# Patient Record
Sex: Female | Born: 1935 | Race: White | Hispanic: No | Smoking: Former smoker
Health system: Southern US, Community
[De-identification: ages and names within clinical notes are randomized; demographics above are authoritative.]

## PROBLEM LIST (undated history)

## (undated) DIAGNOSIS — R0609 Other forms of dyspnea: Secondary | ICD-10-CM

## (undated) DIAGNOSIS — J449 Chronic obstructive pulmonary disease, unspecified: Secondary | ICD-10-CM

## (undated) DIAGNOSIS — J45909 Unspecified asthma, uncomplicated: Secondary | ICD-10-CM

## (undated) DIAGNOSIS — I480 Paroxysmal atrial fibrillation: Secondary | ICD-10-CM

## (undated) DIAGNOSIS — I351 Nonrheumatic aortic (valve) insufficiency: Secondary | ICD-10-CM

## (undated) DIAGNOSIS — R0600 Dyspnea, unspecified: Secondary | ICD-10-CM

## (undated) DIAGNOSIS — R42 Dizziness and giddiness: Secondary | ICD-10-CM

## (undated) DIAGNOSIS — D649 Anemia, unspecified: Secondary | ICD-10-CM

## (undated) DIAGNOSIS — I350 Nonrheumatic aortic (valve) stenosis: Secondary | ICD-10-CM

## (undated) DIAGNOSIS — I10 Essential (primary) hypertension: Secondary | ICD-10-CM

## (undated) DIAGNOSIS — Z8719 Personal history of other diseases of the digestive system: Secondary | ICD-10-CM

## (undated) HISTORY — DX: Other forms of dyspnea: R06.09

## (undated) HISTORY — DX: Dizziness and giddiness: R42

## (undated) HISTORY — PX: CHOLECYSTECTOMY: SHX55

## (undated) HISTORY — DX: Personal history of other diseases of the digestive system: Z87.19

## (undated) HISTORY — DX: Dyspnea, unspecified: R06.00

## (undated) HISTORY — DX: Chronic obstructive pulmonary disease, unspecified: J44.9

## (undated) HISTORY — DX: Paroxysmal atrial fibrillation: I48.0

## (undated) HISTORY — DX: Nonrheumatic aortic (valve) insufficiency: I35.1

## (undated) HISTORY — DX: Anemia, unspecified: D64.9

## (undated) HISTORY — DX: Essential (primary) hypertension: I10

## (undated) HISTORY — DX: Nonrheumatic aortic (valve) stenosis: I35.0

## (undated) HISTORY — PX: HERNIA REPAIR: SHX51

## (undated) SURGERY — ERCP, WITH INTERVENTION IF INDICATED
Anesthesia: General

---

## 1997-06-19 ENCOUNTER — Ambulatory Visit (HOSPITAL_COMMUNITY): Admission: RE | Admit: 1997-06-19 | Discharge: 1997-06-19 | Payer: Self-pay | Admitting: *Deleted

## 1997-11-18 ENCOUNTER — Ambulatory Visit (HOSPITAL_COMMUNITY): Admission: RE | Admit: 1997-11-18 | Discharge: 1997-11-18 | Payer: Self-pay | Admitting: Obstetrics & Gynecology

## 1997-11-27 ENCOUNTER — Ambulatory Visit (HOSPITAL_COMMUNITY): Admission: RE | Admit: 1997-11-27 | Discharge: 1997-11-27 | Payer: Self-pay | Admitting: Endocrinology

## 1998-01-26 ENCOUNTER — Inpatient Hospital Stay (HOSPITAL_COMMUNITY): Admission: RE | Admit: 1998-01-26 | Discharge: 1998-02-01 | Payer: Self-pay

## 1998-03-10 ENCOUNTER — Inpatient Hospital Stay (HOSPITAL_COMMUNITY): Admission: RE | Admit: 1998-03-10 | Discharge: 1998-03-12 | Payer: Self-pay | Admitting: Plastic Surgery

## 1998-03-10 ENCOUNTER — Encounter: Payer: Self-pay | Admitting: Plastic Surgery

## 1998-03-12 ENCOUNTER — Encounter: Payer: Self-pay | Admitting: Plastic Surgery

## 1998-12-15 ENCOUNTER — Inpatient Hospital Stay (HOSPITAL_COMMUNITY): Admission: EM | Admit: 1998-12-15 | Discharge: 1998-12-18 | Payer: Self-pay

## 1998-12-15 ENCOUNTER — Encounter (INDEPENDENT_AMBULATORY_CARE_PROVIDER_SITE_OTHER): Payer: Self-pay | Admitting: Specialist

## 1998-12-15 ENCOUNTER — Encounter: Payer: Self-pay | Admitting: Gastroenterology

## 1999-02-05 ENCOUNTER — Ambulatory Visit (HOSPITAL_COMMUNITY): Admission: RE | Admit: 1999-02-05 | Discharge: 1999-02-05 | Payer: Self-pay | Admitting: Gastroenterology

## 1999-02-05 ENCOUNTER — Encounter: Payer: Self-pay | Admitting: Gastroenterology

## 1999-03-16 ENCOUNTER — Encounter (INDEPENDENT_AMBULATORY_CARE_PROVIDER_SITE_OTHER): Payer: Self-pay | Admitting: Specialist

## 1999-03-16 ENCOUNTER — Encounter: Payer: Self-pay | Admitting: Gastroenterology

## 1999-03-16 ENCOUNTER — Inpatient Hospital Stay (HOSPITAL_COMMUNITY): Admission: RE | Admit: 1999-03-16 | Discharge: 1999-03-20 | Payer: Self-pay | Admitting: Gastroenterology

## 1999-03-19 ENCOUNTER — Encounter: Payer: Self-pay | Admitting: Vascular Surgery

## 1999-08-31 ENCOUNTER — Encounter: Admission: RE | Admit: 1999-08-31 | Discharge: 1999-08-31 | Payer: Self-pay | Admitting: Internal Medicine

## 1999-08-31 ENCOUNTER — Encounter: Payer: Self-pay | Admitting: Internal Medicine

## 2000-06-12 ENCOUNTER — Other Ambulatory Visit: Admission: RE | Admit: 2000-06-12 | Discharge: 2000-06-12 | Payer: Self-pay | Admitting: Otolaryngology

## 2000-09-04 ENCOUNTER — Encounter: Admission: RE | Admit: 2000-09-04 | Discharge: 2000-09-04 | Payer: Self-pay | Admitting: Internal Medicine

## 2000-09-04 ENCOUNTER — Encounter: Payer: Self-pay | Admitting: Internal Medicine

## 2000-10-12 ENCOUNTER — Emergency Department (HOSPITAL_COMMUNITY): Admission: EM | Admit: 2000-10-12 | Discharge: 2000-10-12 | Payer: Self-pay | Admitting: Emergency Medicine

## 2003-10-31 ENCOUNTER — Ambulatory Visit (HOSPITAL_COMMUNITY): Admission: RE | Admit: 2003-10-31 | Discharge: 2003-10-31 | Payer: Self-pay | Admitting: Gastroenterology

## 2005-02-06 ENCOUNTER — Emergency Department (HOSPITAL_COMMUNITY): Admission: EM | Admit: 2005-02-06 | Discharge: 2005-02-06 | Payer: Self-pay | Admitting: Emergency Medicine

## 2005-02-11 ENCOUNTER — Ambulatory Visit (HOSPITAL_COMMUNITY): Admission: RE | Admit: 2005-02-11 | Discharge: 2005-02-13 | Payer: Self-pay | Admitting: Orthopedic Surgery

## 2005-03-02 ENCOUNTER — Ambulatory Visit: Admission: RE | Admit: 2005-03-02 | Discharge: 2005-03-02 | Payer: Self-pay | Admitting: Orthopedic Surgery

## 2007-09-21 ENCOUNTER — Emergency Department (HOSPITAL_COMMUNITY): Admission: EM | Admit: 2007-09-21 | Discharge: 2007-09-21 | Payer: Self-pay | Admitting: Emergency Medicine

## 2008-01-08 ENCOUNTER — Inpatient Hospital Stay (HOSPITAL_COMMUNITY): Admission: EM | Admit: 2008-01-08 | Discharge: 2008-01-10 | Payer: Self-pay | Admitting: Emergency Medicine

## 2008-02-18 ENCOUNTER — Encounter: Admission: RE | Admit: 2008-02-18 | Discharge: 2008-02-18 | Payer: Self-pay | Admitting: Internal Medicine

## 2008-02-29 HISTORY — PX: CARDIOVASCULAR STRESS TEST: SHX262

## 2008-03-10 ENCOUNTER — Inpatient Hospital Stay (HOSPITAL_BASED_OUTPATIENT_CLINIC_OR_DEPARTMENT_OTHER): Admission: RE | Admit: 2008-03-10 | Discharge: 2008-03-10 | Payer: Self-pay | Admitting: Cardiovascular Disease

## 2008-03-10 HISTORY — PX: CARDIAC CATHETERIZATION: SHX172

## 2008-10-31 ENCOUNTER — Inpatient Hospital Stay (HOSPITAL_COMMUNITY): Admission: EM | Admit: 2008-10-31 | Discharge: 2008-11-04 | Payer: Self-pay | Admitting: Emergency Medicine

## 2008-11-16 ENCOUNTER — Inpatient Hospital Stay (HOSPITAL_COMMUNITY): Admission: EM | Admit: 2008-11-16 | Discharge: 2008-11-23 | Payer: Self-pay | Admitting: Emergency Medicine

## 2009-10-27 ENCOUNTER — Emergency Department (HOSPITAL_COMMUNITY): Admission: EM | Admit: 2009-10-27 | Discharge: 2009-10-27 | Payer: Self-pay | Admitting: Emergency Medicine

## 2010-01-25 ENCOUNTER — Ambulatory Visit: Payer: Self-pay | Admitting: Cardiology

## 2010-02-05 ENCOUNTER — Encounter: Payer: Self-pay | Admitting: Cardiology

## 2010-02-05 ENCOUNTER — Ambulatory Visit (HOSPITAL_COMMUNITY)
Admission: RE | Admit: 2010-02-05 | Discharge: 2010-02-05 | Payer: Self-pay | Source: Home / Self Care | Attending: Cardiology | Admitting: Cardiology

## 2010-02-05 ENCOUNTER — Other Ambulatory Visit: Payer: Self-pay | Admitting: Cardiology

## 2010-02-05 ENCOUNTER — Ambulatory Visit: Admission: RE | Admit: 2010-02-05 | Discharge: 2010-02-05 | Payer: Self-pay | Source: Home / Self Care

## 2010-02-05 HISTORY — PX: TRANSTHORACIC ECHOCARDIOGRAM: SHX275

## 2010-04-14 LAB — COMPREHENSIVE METABOLIC PANEL
ALT: 24 U/L (ref 0–35)
ALT: 24 U/L (ref 0–35)
AST: 25 U/L (ref 0–37)
AST: 25 U/L (ref 0–37)
Albumin: 1.9 g/dL — ABNORMAL LOW (ref 3.5–5.2)
Albumin: 1.9 g/dL — ABNORMAL LOW (ref 3.5–5.2)
Albumin: 1.9 g/dL — ABNORMAL LOW (ref 3.5–5.2)
Albumin: 2.2 g/dL — ABNORMAL LOW (ref 3.5–5.2)
Albumin: 2.4 g/dL — ABNORMAL LOW (ref 3.5–5.2)
Alkaline Phosphatase: 53 U/L (ref 39–117)
Alkaline Phosphatase: 59 U/L (ref 39–117)
BUN: 10 mg/dL (ref 6–23)
BUN: 21 mg/dL (ref 6–23)
BUN: 23 mg/dL (ref 6–23)
BUN: 24 mg/dL — ABNORMAL HIGH (ref 6–23)
CO2: 22 mEq/L (ref 19–32)
CO2: 25 mEq/L (ref 19–32)
Calcium: 7.8 mg/dL — ABNORMAL LOW (ref 8.4–10.5)
Calcium: 8.1 mg/dL — ABNORMAL LOW (ref 8.4–10.5)
Chloride: 110 mEq/L (ref 96–112)
Creatinine, Ser: 0.77 mg/dL (ref 0.4–1.2)
Creatinine, Ser: 0.92 mg/dL (ref 0.4–1.2)
GFR calc Af Amer: 37 mL/min — ABNORMAL LOW (ref >60)
GFR calc non Af Amer: 31 mL/min — ABNORMAL LOW (ref >60)
GFR calc non Af Amer: 59 mL/min — ABNORMAL LOW (ref >60)
GFR calc non Af Amer: >60 mL/min (ref >60)
Glucose, Bld: 106 mg/dL — ABNORMAL HIGH (ref 70–99)
Glucose, Bld: 124 mg/dL — ABNORMAL HIGH (ref 70–99)
Glucose, Bld: 163 mg/dL — ABNORMAL HIGH (ref 70–99)
Glucose, Bld: 169 mg/dL — ABNORMAL HIGH (ref 70–99)
Potassium: 5 mEq/L (ref 3.5–5.1)
Potassium: 5.1 mEq/L (ref 3.5–5.1)
Sodium: 136 mEq/L (ref 135–145)
Sodium: 141 mEq/L (ref 135–145)
Total Bilirubin: 0.4 mg/dL (ref 0.3–1.2)
Total Bilirubin: 0.7 mg/dL (ref 0.3–1.2)
Total Bilirubin: 2 mg/dL — ABNORMAL HIGH (ref 0.3–1.2)
Total Protein: 5.3 g/dL — ABNORMAL LOW (ref 6.0–8.3)
Total Protein: 5.4 g/dL — ABNORMAL LOW (ref 6.0–8.3)
Total Protein: 5.5 g/dL — ABNORMAL LOW (ref 6.0–8.3)

## 2010-04-14 LAB — GLUCOSE, CAPILLARY
Glucose-Capillary: 102 mg/dL — ABNORMAL HIGH (ref 70–99)
Glucose-Capillary: 102 mg/dL — ABNORMAL HIGH (ref 70–99)
Glucose-Capillary: 107 mg/dL — ABNORMAL HIGH (ref 70–99)
Glucose-Capillary: 109 mg/dL — ABNORMAL HIGH (ref 70–99)
Glucose-Capillary: 116 mg/dL — ABNORMAL HIGH (ref 70–99)
Glucose-Capillary: 121 mg/dL — ABNORMAL HIGH (ref 70–99)
Glucose-Capillary: 125 mg/dL — ABNORMAL HIGH (ref 70–99)
Glucose-Capillary: 126 mg/dL — ABNORMAL HIGH (ref 70–99)
Glucose-Capillary: 138 mg/dL — ABNORMAL HIGH (ref 70–99)
Glucose-Capillary: 138 mg/dL — ABNORMAL HIGH (ref 70–99)
Glucose-Capillary: 142 mg/dL — ABNORMAL HIGH (ref 70–99)
Glucose-Capillary: 155 mg/dL — ABNORMAL HIGH (ref 70–99)
Glucose-Capillary: 157 mg/dL — ABNORMAL HIGH (ref 70–99)
Glucose-Capillary: 165 mg/dL — ABNORMAL HIGH (ref 70–99)
Glucose-Capillary: 165 mg/dL — ABNORMAL HIGH (ref 70–99)
Glucose-Capillary: 173 mg/dL — ABNORMAL HIGH (ref 70–99)
Glucose-Capillary: 174 mg/dL — ABNORMAL HIGH (ref 70–99)
Glucose-Capillary: 216 mg/dL — ABNORMAL HIGH (ref 70–99)
Glucose-Capillary: 217 mg/dL — ABNORMAL HIGH (ref 70–99)
Glucose-Capillary: 87 mg/dL (ref 70–99)

## 2010-04-14 LAB — CBC
HCT: 24.8 % — ABNORMAL LOW (ref 36.0–46.0)
HCT: 26.7 % — ABNORMAL LOW (ref 36.0–46.0)
HCT: 30.7 % — ABNORMAL LOW (ref 36.0–46.0)
HCT: 33.7 % — ABNORMAL LOW (ref 36.0–46.0)
HCT: 34.1 % — ABNORMAL LOW (ref 36.0–46.0)
Hemoglobin: 10.4 g/dL — ABNORMAL LOW (ref 12.0–15.0)
Hemoglobin: 8 g/dL — ABNORMAL LOW (ref 12.0–15.0)
Hemoglobin: 8.3 g/dL — ABNORMAL LOW (ref 12.0–15.0)
Hemoglobin: 9.9 g/dL — ABNORMAL LOW (ref 12.0–15.0)
MCHC: 33.5 g/dL (ref 30.0–36.0)
MCHC: 33.6 g/dL (ref 30.0–36.0)
MCV: 93.7 fL (ref 78.0–100.0)
MCV: 95.4 fL (ref 78.0–100.0)
MCV: 95.6 fL (ref 78.0–100.0)
Platelets: 242 10*3/uL (ref 150–400)
Platelets: 262 10*3/uL (ref 150–400)
Platelets: 264 10*3/uL (ref 150–400)
Platelets: 462 10*3/uL — ABNORMAL HIGH (ref 150–400)
RBC: 2.56 MIL/uL — ABNORMAL LOW (ref 3.87–5.11)
RBC: 2.59 MIL/uL — ABNORMAL LOW (ref 3.87–5.11)
RBC: 3.29 MIL/uL — ABNORMAL LOW (ref 3.87–5.11)
RDW: 14 % (ref 11.5–15.5)
RDW: 14.8 % (ref 11.5–15.5)
RDW: 16.6 % — ABNORMAL HIGH (ref 11.5–15.5)
RDW: 17.5 % — ABNORMAL HIGH (ref 11.5–15.5)
WBC: 12.2 10*3/uL — ABNORMAL HIGH (ref 4.0–10.5)
WBC: 21.9 10*3/uL — ABNORMAL HIGH (ref 4.0–10.5)
WBC: 6.2 10*3/uL (ref 4.0–10.5)
WBC: 7.8 10*3/uL (ref 4.0–10.5)
WBC: 9.3 10*3/uL (ref 4.0–10.5)

## 2010-04-14 LAB — BASIC METABOLIC PANEL
BUN: 17 mg/dL (ref 6–23)
CO2: 26 mEq/L (ref 19–32)
Chloride: 111 mEq/L (ref 96–112)
Creatinine, Ser: 0.68 mg/dL (ref 0.4–1.2)
GFR calc non Af Amer: >60 mL/min (ref >60)
GFR calc non Af Amer: >60 mL/min (ref >60)
Glucose, Bld: 86 mg/dL (ref 70–99)
Glucose, Bld: 97 mg/dL (ref 70–99)
Potassium: 4 mEq/L (ref 3.5–5.1)
Potassium: 4.3 mEq/L (ref 3.5–5.1)
Sodium: 138 mEq/L (ref 135–145)

## 2010-04-14 LAB — HEMOCCULT GUIAC POC 1CARD (OFFICE)
Fecal Occult Bld: NEGATIVE
Fecal Occult Bld: NEGATIVE
Fecal Occult Bld: POSITIVE

## 2010-04-14 LAB — CROSSMATCH: Antibody Screen: NEGATIVE

## 2010-04-14 LAB — URINALYSIS, ROUTINE W REFLEX MICROSCOPIC
Bilirubin Urine: NEGATIVE
Glucose, UA: NEGATIVE mg/dL
Hgb urine dipstick: NEGATIVE
Ketones, ur: NEGATIVE mg/dL
Protein, ur: NEGATIVE mg/dL
Specific Gravity, Urine: 1.017 (ref 1.005–1.030)
Urobilinogen, UA: 0.2 mg/dL (ref 0.0–1.0)
pH: 5.5 (ref 5.0–8.0)

## 2010-04-14 LAB — SAMPLE TO BLOOD BANK

## 2010-04-14 LAB — TSH
TSH: 0.459 u[IU]/mL (ref 0.350–4.500)
TSH: 0.647 u[IU]/mL (ref 0.350–4.500)

## 2010-04-14 LAB — DIFFERENTIAL
Basophils Relative: 0 % (ref 0–1)
Lymphs Abs: 0.8 10*3/uL (ref 0.7–4.0)
Monocytes Relative: 5 % (ref 3–12)
Neutro Abs: 20 10*3/uL — ABNORMAL HIGH (ref 1.7–7.7)

## 2010-04-14 LAB — LACTIC ACID, PLASMA: Lactic Acid, Venous: 2.7 mmol/L — ABNORMAL HIGH (ref 0.5–2.2)

## 2010-04-15 LAB — URINALYSIS, ROUTINE W REFLEX MICROSCOPIC
Bilirubin Urine: NEGATIVE
Hgb urine dipstick: NEGATIVE
Ketones, ur: NEGATIVE mg/dL
Nitrite: NEGATIVE
Specific Gravity, Urine: 1.017 (ref 1.005–1.030)
pH: 5.5 (ref 5.0–8.0)

## 2010-04-15 LAB — CBC
HCT: 28.6 % — ABNORMAL LOW (ref 36.0–46.0)
HCT: 29.7 % — ABNORMAL LOW (ref 36.0–46.0)
HCT: 31.9 % — ABNORMAL LOW (ref 36.0–46.0)
Hemoglobin: 10.7 g/dL — ABNORMAL LOW (ref 12.0–15.0)
MCHC: 34.3 g/dL (ref 30.0–36.0)
MCV: 96 fL (ref 78.0–100.0)
MCV: 97.7 fL (ref 78.0–100.0)
Platelets: 269 10*3/uL (ref 150–400)
RBC: 3.09 MIL/uL — ABNORMAL LOW (ref 3.87–5.11)
RBC: 3.26 MIL/uL — ABNORMAL LOW (ref 3.87–5.11)
RDW: 13.4 % (ref 11.5–15.5)
WBC: 10.8 10*3/uL — ABNORMAL HIGH (ref 4.0–10.5)
WBC: 12 10*3/uL — ABNORMAL HIGH (ref 4.0–10.5)
WBC: 12.9 10*3/uL — ABNORMAL HIGH (ref 4.0–10.5)

## 2010-04-15 LAB — GLUCOSE, CAPILLARY
Glucose-Capillary: 142 mg/dL — ABNORMAL HIGH (ref 70–99)
Glucose-Capillary: 147 mg/dL — ABNORMAL HIGH (ref 70–99)
Glucose-Capillary: 154 mg/dL — ABNORMAL HIGH (ref 70–99)
Glucose-Capillary: 158 mg/dL — ABNORMAL HIGH (ref 70–99)
Glucose-Capillary: 186 mg/dL — ABNORMAL HIGH (ref 70–99)
Glucose-Capillary: 186 mg/dL — ABNORMAL HIGH (ref 70–99)
Glucose-Capillary: 202 mg/dL — ABNORMAL HIGH (ref 70–99)
Glucose-Capillary: 227 mg/dL — ABNORMAL HIGH (ref 70–99)

## 2010-04-15 LAB — HEMOGLOBIN A1C
Hgb A1c MFr Bld: 5.5 % (ref 4.6–6.1)
Mean Plasma Glucose: 111 mg/dL

## 2010-04-15 LAB — CULTURE, BLOOD (ROUTINE X 2): Culture: NO GROWTH

## 2010-04-15 LAB — DIFFERENTIAL
Eosinophils Absolute: 0 10*3/uL (ref 0.0–0.7)
Eosinophils Relative: 0 % (ref 0–5)
Lymphocytes Relative: 10 % — ABNORMAL LOW (ref 12–46)
Lymphs Abs: 1.1 10*3/uL (ref 0.7–4.0)
Monocytes Relative: 2 % — ABNORMAL LOW (ref 3–12)

## 2010-04-15 LAB — BASIC METABOLIC PANEL
BUN: 12 mg/dL (ref 6–23)
BUN: 22 mg/dL (ref 6–23)
CO2: 33 mEq/L — ABNORMAL HIGH (ref 19–32)
Calcium: 8.6 mg/dL (ref 8.4–10.5)
Chloride: 104 mEq/L (ref 96–112)
Creatinine, Ser: 0.73 mg/dL (ref 0.4–1.2)
GFR calc non Af Amer: >60 mL/min (ref >60)
Glucose, Bld: 156 mg/dL — ABNORMAL HIGH (ref 70–99)
Potassium: 4.2 mEq/L (ref 3.5–5.1)
Potassium: 4.9 mEq/L (ref 3.5–5.1)

## 2010-04-15 LAB — POCT I-STAT, CHEM 8
Calcium, Ion: 1.14 mmol/L (ref 1.12–1.32)
Chloride: 106 mEq/L (ref 96–112)
HCT: 33 % — ABNORMAL LOW (ref 36.0–46.0)
Hemoglobin: 11.2 g/dL — ABNORMAL LOW (ref 12.0–15.0)
Potassium: 3.9 mEq/L (ref 3.5–5.1)

## 2010-04-15 LAB — POCT CARDIAC MARKERS: Troponin i, poc: <0.05 ng/mL (ref 0.00–0.09)

## 2010-04-15 LAB — URINE CULTURE
Colony Count: NO GROWTH
Culture: NO GROWTH

## 2010-04-15 LAB — PROTIME-INR: INR: 0.9 (ref 0.00–1.49)

## 2010-05-25 NOTE — H&P (Signed)
NAMEZarra, [name]                 ACCOUNT NO.:  [id number]   MEDICAL RECORD NO.:  [id number]          PATIENT TYPE:  INP   LOCATION:  5118                         FACILITY:  [facility]   PHYSICIAN:  Vesta Mixer, M.D. DATE OF BIRTH:  [birth year]   DATE OF ADMISSION:  01/08/2008  DATE OF DISCHARGE:  01/10/2008                              HISTORY & PHYSICAL   [name] is an elderly female with a history of chest pain and  shortness of breath.  We are asked to see her.  She is admitted today  for heart catheterization after having an abnormal stress test.   [name] is an elderly female with a history of COPD.  She recently  stopped smoking in September 2008.  She has a history of hypertension,  hyperlipidemia.  She also has a history of ischemic colitis.  She has  been having some episodes of chest pain off and on for the past several  years.  These episodes of chest pain are described as a pressure.  The  pain radiates out to the left arm.  It is associated with some left arm  fatigue and some shortness of breath.  She has not had any episodes of  syncope or presyncope.   CURRENT MEDICATIONS:  1. Verapamil SR 240 mg a day.  2. Bystolic 10 mg a day.  3. Lisinopril 20 mg a day.  4. Amitriptyline 50 mg a day.  5. Lipitor 20 mg a day.  6. Aspirin 81 mg a day.  7. Vitamin B12 once a day.  8. Iron tablets once a day.  9. Spiriva as needed.  10.Omeprazole 20 mg a day.   ALLERGIES:  None.   PAST MEDICAL HISTORY:  1. History of COPD.  She stopped smoking a year or so ago.  2. Hypertension.  3. Hyperlipidemia.  4. History of anemia.  She has a history of ischemic colitis that was      diagnosed in 2000.  She also has had a GI bleed.  She was recently      admitted to the hospital in December 2009 with profound anemia.      She required 2 units of packed red blood cells.  It appears that      they never truly found a real source for her bleeding.  She had      endoscopy and  colonoscopy and apparently they were not able to find      any specific site of bleeding.  5. Hyperlipidemia.  6. Hypertension.   SOCIAL HISTORY:  The patient smoked until a year ago.   FAMILY HISTORY:  Positive for cardiac disease.   REVIEW OF SYSTEMS:  Reviewed.  She denies any heat or cold intolerance,  weight gain, or weight loss.  She denies any PND or orthopnea, syncope  or presyncope.  She does have dyspnea as described above.  All other  systems were reviewed and are negative.   PHYSICAL EXAMINATION:  GENERAL:  She is an elderly female, in no acute  distress.  She is alert and oriented x3, and  her mood and affect are  normal.  VITAL SIGNS:  Weight is 204, blood pressure is 122/70 with a heart rate  of 74.  HEENT:  2+ carotids.  She is normocephalic and atraumatic.  NECK:  She has no bruits, no JVD, no thyromegaly.  Her neck is supple.  LUNGS:  Clear.  HEART:  Regular rate, S1 and S2.  She has a soft systolic murmur.  ABDOMEN:  Good bowel sounds and is nontender.  EXTREMITIES:  She has no clubbing, cyanosis, or edema.  Her pulses are  trace to 1+.  Her distal pulses are intact.  NEUROLOGIC:  Nonfocal.  SKIN:  Warm and dry.   Her EKG reveals normal sinus rhythm.  She has nonspecific ST and T-wave  changes.   [name] presents with a positive Cardiolite study.  She has a  reversible inferior wall defect.  She has a significant history of  cigarette smoking.  Of considerable concern is that she has a history of  profound anemia and in fact was admitted and required 2 units of packed  red blood cells last month.  She would be at extremely high risk to  place on Plavix.  As such, we probably will not be able to stand her.  We will plan on doing a diagnostic heart catheterization and will  proceed from there.  1. Hypertension.  The patient has had some episodes of orthostatic      hypotension.  We will go ahead and stop her lisinopril for the time      being.  We will  follow that clinically.  All of her other medical      problems are relatively stable.      Vesta Mixer, M.D.  Electronically Signed     PJN/MEDQ  D:  03/04/2008  T:  03/05/2008  Job:  [id number]   cc:   Barry Dienes. Eloise Harman, M.D.

## 2010-05-25 NOTE — Op Note (Signed)
NAMEPaisyn, [name]                 ACCOUNT NO.:  [id number]   MEDICAL RECORD NO.:  [id number]          PATIENT TYPE:  INP   LOCATION:                               FACILITY:  [facility]   PHYSICIAN:  John C. Madilyn Fireman, M.D.    DATE OF BIRTH:  [birth year]   DATE OF PROCEDURE:  01/10/2008  DATE OF DISCHARGE:  01/10/2008                               OPERATIVE REPORT   PROCEDURE:  Esophagogastroduodenoscopy with biopsy.   INDICATIONS FOR PROCEDURE:  Anemia with heme-positive stool and  documented several-gram drop in hemoglobin in the last few months.   DESCRIPTION OF PROCEDURE:  The patient was placed in the left lateral  decubitus position and placed on the pulse monitor with continuous low-  flow oxygen delivered by nasal cannula.  She was sedated with 75 mcg IV  fentanyl and 7.5 mg IV Versed.  The Olympus video endoscope was advanced  under direct vision into the oropharynx and esophagus.  The esophagus  was straight and of normal caliber with the squamocolumnar line at 38  cm.  There was no visible hiatal hernia, ring, stricture, or other  abnormality of the GE junction.  The stomach was entered and a small  amount of liquid secretions were suctioned from the fundus.  Retroflexed  view of the cardia was unremarkable.  The fundus and body appeared  normal with no ulcer or erosion.  Within the antrum, there were seen  some scattered erythema and granularity with no focal erosions or  ulcers.  This was consistent with a mild antral gastritis.  A CLO-test  was obtained.  The pylorus was nondeformed and easily allowed passage of  the endoscope tip into the duodenum.  Both the bulb and second portion  were well inspected and appeared to be within normal limits.  The scope  was then withdrawn and the patient prepared for colonoscopy.  She  tolerated the procedure well.  There were no immediate complications.   IMPRESSION:  Mild antral gastritis.  No focal abnormalities or stigma of   hemorrhage.   PLAN:  We will proceed with colonoscopy and await CLO-test.           ______________________________  Everardo All. Madilyn Fireman, M.D.     JCH/MEDQ  D:  01/10/2008  T:  01/11/2008  Job:  [id number]   cc:   Barry Dienes. Eloise Harman, M.D.

## 2010-05-25 NOTE — H&P (Signed)
NAMEKlani, [name]                 ACCOUNT NO.:  [id number]   MEDICAL RECORD NO.:  [id number]          PATIENT TYPE:  INP   LOCATION:  5118                         FACILITY:  [facility]   PHYSICIAN:  Barry Dienes. Eloise Harman, M.D.DATE OF BIRTH:  [birth year]   DATE OF ADMISSION:  01/08/2008  DATE OF DISCHARGE:                              HISTORY & PHYSICAL   CHIEF COMPLAINT:  Difficulty breathing.   HISTORY OF PRESENT ILLNESS:  The patient is a 77 year old white female  with progressive dyspnea, now New York Heart Association class III, over  the past 2-3 weeks.  This has not been associated with fever, chest  pain, or change in her bowel movements.  On the day before Christmas,  she had 1 dark-colored bowel movement, however, since then her bowel  movements have been brown in color and normal consistency.  She has not  had nausea or vomiting and has had a normal appetite.  Upon arrival to  the emergency room after she called 911, she was given albuterol with  Atrovent nebulizers, oxygen, and Solu-Medrol 125 mg IV.  With those  treatments, she feels better and does not have dyspnea at rest.   PAST MEDICAL HISTORY:  1. COPD with ongoing tobacco abuse until September 2008.  2. Hypertension.  3. Hyperlipidemia.  4. December 2000 episode of ischemic colitis that led to a mesenteric      arteriogram that showed no significant mesenteric arterial disease.   MEDICATIONS PRIOR TO ADMISSION:  1. Calan SR 240 mg p.o. daily.  2. Bystolic 10 mg daily.  3. Lisinopril 20 mg daily.  4. Elavil 50 mg nightly.  5. Lipitor 20 mg daily.  6. Spiriva 18 mcg p.o. daily as needed.  7. Aspirin 81 mg daily.   ALLERGIES:  No known drug allergies.   PAST SURGICAL HISTORY:  Year 2000 ventral hernia repair, remote  cholecystectomy, year 2000 total abdominal hysterectomy, February 2007  right rotator cuff surgery via open incision.   FAMILY HISTORY:  She has close relatives who have had diabetes mellitus,  but  none that have had colon cancer or coronary artery disease.   SOCIAL HISTORY:  She lives with her daughter.  She has 3 children (2  sons and 1 daughter), a daughter age 28 years died from a drug overdose.  She has a long history of tobacco abuse that was discontinued in  September 2008, and no history of alcohol abuse.   REVIEW OF SYSTEMS:  She has a mild dry cough lately.  She has not had  recent fever, chills, chest pain or palpitations, nausea, vomiting,  diarrhea, constipation, anxiety, or depression.   INITIAL PHYSICAL EXAMINATION:  VITAL SIGNS:  Blood pressure 124/100,  pulse 63, respirations 20, temperature 98.1, and pulse oxygen saturation  97% on room air.  GENERAL:  She is an overweight white female, who is pale and had an  occasional dry cough without shortness of breath at rest.  HEENT:  Significant for bilateral pale conjunctivae.  NECK:  Supple without jugular venous distention or carotid bruit.  CHEST:  Clear to auscultation.  HEART:  Regular rate and rhythm with a systolic ejection murmur of grade  2/6 at the left sternal border.  ABDOMEN:  Normal bowel sounds and no hepatosplenomegaly or tenderness.  EXTREMITIES:  Without cyanosis, clubbing, or edema.  NEUROLOGIC:  Nonfocal.   LABORATORY STUDIES:  White blood cell count 7.5, hemoglobin 7.6,  hematocrit 23.5, platelets 299, MCV 95, and RDW 14.6.  PT 13 and PTT 28.  Serum sodium 142, potassium 3.8, chloride 111, carbon dioxide 27, BUN  14, creatinine 0.70, and glucose 124.  Troponin I 0.01, total CPK was  61, and BNP 132.  Arterial blood gas had pH 7.37, pCO2 of 43, pO2 of 88  (96% saturation).  A chest x-ray PA and lateral report was pending at  the time of dictation and anemia panel was sent by the emergency room  physician.  EKG showed the following:  1. Normal sinus rhythm.  2. Q-waves in leads V1, V2, and V3 suggestive of an old septal      myocardial infarction.  3. Nonspecific T-wave abnormalities.    IMPRESSION AND PLAN:  1. Dyspnea on exertion:  This is most likely secondary to moderate      anemia given the near-normal, BNP level, her lung exam, and her low      hemoglobin level.  It is less likely that her dyspnea is due to her      primary cardiac etiology or to early pneumonia or pulmonary      embolism.  She does not appear to have an acute GI bleed.  I plan      to Hemoccult test several stools.  We will obtain a GI physician      consultation.  I will review the results of the anemia panel sent      from the emergency room.  She will receive a transfusion of 2 units      of packed red blood cells and will continue proton pump inhibitor      treatment for the unlikely possibility of an upper GI source of      blood loss.  We will continue ongoing treatment for COPD and reduce      the Solu-Medrol somewhat as this does not appear to be the primary      cause of her dyspnea.  2. Hypertension:  Her blood pressure initially was somewhat elevated      and now is in a more acceptable range.  For now, given her unclear      intravascular volume status, we will continue Calan, but hold      Bystolic.  The Bystolic will be restarted if necessary if her blood      pressure climbs too high.  3. Hyperlipidemia:  Well controlled on Lipitor.           ______________________________  Barry Dienes. Eloise Harman, M.D.     DGP/MEDQ  D:  01/08/2008  T:  01/09/2008  Job:  [id number]   cc:   Larina Earthly, M.D.  Almedia Balls. Ranell Patrick, M.D.  Di Kindle. Edilia Bo, M.D.  James L. Malon Kindle., M.D.

## 2010-05-25 NOTE — Cardiovascular Report (Signed)
[name], [name]                 ACCOUNT NO.:  [id number]   MEDICAL RECORD NO.:  [id number]          PATIENT TYPE:  OIB   LOCATION:  1961                         FACILITY:  [facility]   PHYSICIAN:  Vesta Mixer, M.D. DATE OF BIRTH:  08/23/1929   DATE OF PROCEDURE:  03/10/2008  DATE OF DISCHARGE:  03/10/2008                            CARDIAC CATHETERIZATION   [name] is a 76 year old female with a long history of cigarette  smoking.  She has a history of COPD.  She recently had a stress  Cardiolite study by Dr. [name].  She was found to have an inferior  wall defect.  She was referred for heart catheterization for further  evaluation.   The procedure was left heart catheterization with coronary angiography.   The right femoral artery was easily cannulated using modified Seldinger  technique.   HEMODYNAMICS:  The LV pressure is 142/20 with an aortic pressure of  140/54.   ANGIOGRAPHY:  The left main:  The left main has minor luminal  irregularities.   The left anterior descending artery has minor irregularities.  There is  a moderate-sized diagonal artery, which is normal.  The left circumflex  artery is quite small but is normal.   The right coronary artery is large and dominant.  There is minor luminal  irregularities.  The posterior descending artery and the posterolateral  segment artery are fairly moderate to large in size and only had minor  luminal irregularities.   The left ventriculogram was performed in the 30 RAO position.  It  reveals normal left ventricular systolic function.  The ejection  fraction is around 65%.   COMPLICATIONS:  None.   CONCLUSIONS:  1. Minor coronary artery irregularities.  2. Normal left ventricular systolic function.   We will continue with medical therapy.  Her inferior wall defect was  most likely artifact due to diaphragmatic attenuation.  She will  continue with medical therapy.     Vesta Mixer, M.D.  Electronically Signed    PJN/MEDQ  D:  03/10/2008  T:  03/10/2008  Job:  [id number]   cc:   Barry Dienes. Eloise Harman, M.D.

## 2010-05-25 NOTE — Consult Note (Signed)
NAMEReem, [name]                 ACCOUNT NO.:  [id number]   MEDICAL RECORD NO.:  [id number]          PATIENT TYPE:  INP   LOCATION:                               FACILITY:  [facility]   PHYSICIAN:  John C. Madilyn Fireman, M.D.    DATE OF BIRTH:  03-23-1929   DATE OF CONSULTATION:  01/09/2008  DATE OF DISCHARGE:  01/10/2008                                 CONSULTATION   We were asked to see [name] today in consultation for a hemoglobin  of 7 by Dr. Jarome Matin.   HISTORY OF PRESENT ILLNESS:  This is a 77 year old female with history  of multiple abdominal surgeries, as well as ischemic colitis.  She  describes severe heartburn after each meal.  She also tells me that she  takes occasional NSAIDs along with her 81 mg aspirin and does not use a  daily PPI.  The patient has had no abdominal pain or diarrhea as she had  with previous episodes of ischemic colitis, rather she describes 3 weeks  of URI symptoms along with increasing dyspnea on exertion and weakness.  She also tells me that she has had a few black stools, mostly just an  increasing weakness.  She has a history of recurrent ischemic colitis  starting in 1999 with her last episode in 2008; however, her current  symptoms sound more like an upper tract problem.   Past medical history is significant for:  1. COPD/asthma.  2. Hypertension.  3. Hyperlipidemia.  4. Ischemic colitis.  5. She has a history of endometriosis.  She is status post 2      exploratory laparotomies.  6. She also has a history of small bowel obstruction from adhesions.      This is per her report and she tells me that Dr. Daphine Deutscher did lysis      of adhesions on her in 2000.  7. She had a ventral hernia repair.  8. She has had a cholecystectomy, total abdominal hysterectomy, and      right rotator cuff surgery.  9. She had colonoscopies in 2004 and October 2005 by Dr. Randa Evens who      found ischemic colitis along her hepatic flexure and her descending  colon.   Current medications include verapamil, Bystolic, lisinopril, Elavil,  Lipitor, Spiriva, and 81 mg aspirin.   She has had no known drug allergies.   Review of systems is significant for weakness and increasing dyspnea on  exertion.  No anorexia.  She has had URI symptoms for 3 weeks.   Social history is positive for a 27- to 40-year history of tobacco use;  however, she now no longer smokes.  She denies alcohol use and  recreational drug use.   Family history is negative for colon cancer and ulcers.   PHYSICAL EXAMINATION:  GENERAL:  She is alert and oriented, in no  apparent distress.  She is obese.  VITAL SIGNS:  She has a temperature 97.7, pulse 78, respirations 18, and  blood pressure 132/56.  HEART:  Regular rate and rhythm without murmurs, rubs, or gallops.  LUNGS:  Bilateral wheezes.  ABDOMEN:  Soft, nontender, and nondistended with good bowel sounds.   Labs are significant for a hemoglobin of 7.6; however, she is now status  post 2 units of packed red blood cells, so I expect her hemoglobin would  be substantially higher.  Hematocrit 23.5, white count 7.5, platelets  299,000.  BMET is significant for a BUN of 14, creatinine of 0.7.  PTT  of 28, PT of 13, and INR 1.0.  Ferritin is 12, serum iron is 18, TIBC is  337.   ASSESSMENT:  Dr. Dorena Cookey has seen and examined the patient, collected  a history, and reviewed her chart.  His impression is that this is  likely a slow gastrointestinal bleed, possibly upper tract.  However  given her history, I need to evaluate both her upper and lower tracts.  She also has a chronic obstructive pulmonary disease exacerbation,  possibly set off by an upper respiratory virus.  We will plan to prep  for colon endoscopy today and scheduled her colon endoscopy tomorrow  morning at approximately 9:30.   Thanks very much for this consultation.      Stephani Police, PA    ______________________________  Everardo All Madilyn Fireman,  M.D.    MLY/MEDQ  D:  01/09/2008  T:  01/09/2008  Job:  [id number]   cc:   Everardo All. Madilyn Fireman, M.D.  James L. Malon Kindle., M.D.  Barry Dienes Eloise Harman, M.D.

## 2010-05-25 NOTE — Discharge Summary (Signed)
[name], [name]                 ACCOUNT NO.:  [id number]   MEDICAL RECORD NO.:  [id number]          PATIENT TYPE:  INP   LOCATION:  5118                         FACILITY:  [facility]   PHYSICIAN:  Barry Dienes. Eloise Harman, M.D.DATE OF BIRTH:  10/06/1929   DATE OF ADMISSION:  01/08/2008  DATE OF DISCHARGE:  01/10/2008                               DISCHARGE SUMMARY   PERTINENT FINDINGS:  The patient is a 77 year old white woman who  presented to the emergency room with progressive dyspnea, at [place]  Heart Association Class III level at the time of evaluation.  The  dyspnea has been gradual in onset over the past 2-3 weeks.  It has not  been associated with fever, chest pain, or persistent change in her  bowel movements.  On the day before Christmas, she had one dark-colored  bowel movement, however, since then her bowel movements have been brown  in color and of normal consistency.  She has not had nausea or vomiting  and has had a normal appetite.  Due to dyspnea with activities of daily  living, she called 911 on the day of admission and was transported to  the emergency room.  She was initially treated with albuterol and  Atrovent nebulizers with oxygen and Solu-Medrol 125 mg IV.  At the time  of my ability evaluation, she was not having dyspnea at rest.   PAST MEDICAL HISTORY:  1. COPD with ongoing tobacco abuse until September 2008.  2. Hypertension.  3. Hyperlipidemia.  4. December 2000 ischemic colitis that led to a mesenteric arteriogram      that showed no significant mesenteric arterial disease.   MEDICATIONS PRIOR TO ADMISSION:  1. Calan SR 240 mg p.o. daily.  2. Bystolic 10 mg daily.  3. Lisinopril 20 mg daily.  4. Elavil 50 mg nightly.  5. Lipitor 20 mg daily.  6. Spiriva 18 mcg daily as needed.  7. Aspirin 81 mg daily.   See admission history and physical for details of her allergies, past  surgical history, family history, social history, and review of systems.   INITIAL PHYSICAL EXAMINATION:  VITAL SIGNS:  Blood pressure 124/100,  pulse 63, respirations 20, temperature 98.1, and pulse oxygen saturation  97% on room air.  GENERAL:  She is an overweight white female, who is pale and had an  occasional dry cough without shortness of breath at rest  HEENT:  Significant for bilateral pale conjunctivae.  NECK:  Supple without jugular venous distention or carotid bruit.  CHEST:  Clear to auscultation.  HEART:  Regular rate and rhythm with a systolic ejection murmur of grade  2/6 at the left sternal border.  ABDOMEN:  Normal bowel sounds and no hepatosplenomegaly or tenderness.  EXTREMITIES:  Without cyanosis, clubbing, or edema and the pedal pulses  were normal.  NEUROLOGIC:  She was alert and well oriented with no focal neurologic  deficits.   INITIAL LABORATORY STUDIES:  White blood cell count 7.5, hemoglobin 7.6,  hematocrit 23.5, platelets 299, MCV 95, and RDW 14.6.  PT 13, PTT 28,  serum sodium 142, potassium  3.8, chloride 111, carbon dioxide 27, BUN  14, creatinine 0.70, and glucose 124.  Troponin I was 0.01, total CPK  was 61, and BNP 132.  Arterial blood gas had pH 7.37, pCO2 of 43, and  pO2 of 88 (96% saturation).  A chest x-ray PA and lateral showed  hyperinflation consistent with COPD.   A 12-lead EKG showed the following:  1. Normal sinus rhythm.  2. Q-waves in leads V1, V2, and V3 consistent with septal myocardial      infarction of unclear age versus COPD change.  3. Nonspecific T-wave abnormalities.   HOSPITAL COURSE:  She was admitted to a medical bed without telemetry.  She was seen by a GI consultant who recommended an endoscopy and  colonoscopy.  This is to be done today.  Accordingly, the results of  these studies were pending at the time of dictation.  She was also  treated with relatively low-dose Solu-Medrol and albuterol and Atrovent  nebulizers.  She was given a transfusion of 2 units of packed red blood  cells and  started on empiric proton pump inhibitor treatment.  Anemia  blood panel showed serum iron 18, total iron binding capacity 337 (5%  saturation), serum B12 of 176, serum folate 14, and serum ferritin 12.   PROCEDURES:  Transfusion of 2 units of packed red blood cells, and  endoscopy and colonoscopy.   COMPLICATIONS:  None.   CONDITION ON DISCHARGE:  She feels fine.  She does not have dyspnea at  rest or with walking to the bathroom.  Her stools are not grossly bloody  or black.  She has been eating well.   MOST RECENT PHYSICAL EXAMINATION:  VITAL SIGNS:  Blood pressure 179/62,  pulse 63, respirations 20, temperature 97.7, pulse oxygen saturation 97%  on 2 L/min.  GENERAL:  She is an elderly, overweight white female, who is in no  apparent distress while lying semi-upright in bed.  CHEST:  Very minimal bilateral scattered wheezing.  HEART:  Regular rate and rhythm with a systolic ejection murmur grade  2/6.  ABDOMEN:  Normal bowel sounds and no hepatosplenomegaly or tenderness.  EXTREMITIES:  Without peripheral edema.   Most recent CBC (prior to transfusion of 2 units of packed red blood  cells) had white blood cell count 7.5, hemoglobin 7.6, hematocrit 23.5,  and platelets 299.  Of note, a repeat CBC result is pending at the time  of dictation.   DISCHARGE DIAGNOSES:  1. Moderately severe iron-deficiency anemia.  2. History of emphysema.  3. Hypertension.  4. Hyperlipidemia.  5. History of ischemic colitis.  6. Vitamin B12 deficiency.  7. Possible gastroesophageal reflux.  8. Chronic obstructive pulmonary disease exacerbation.   DISCHARGE MEDICATIONS:  1. Calan SR 240 mg p.o. daily.  2. Lisinopril 20 mg p.o. daily.  3. Elavil 50 mg p.o. nightly.  4. Lipitor 20 mg p.o. daily.  5. Spiriva 18 mcg inhalation once daily (not p.r.n.).  6. Nu-Iron 150 one tab p.o. daily.  7. Vitamin B12 of 1000 mcg p.o. daily #30.  8. Omeprazole 20 mg daily for 30 days with refill 1.  9.  Bystolic 10 mg p.o. daily.  10.Prednisone 20 mg p.o. daily for 10 days.  11.She was advised to restart Ecotrin 81 mg daily in 1 month following      discharge.   DISPOSITION AND FOLLOWUP:  Following her colonoscopy and endoscopy, she  will be discharged to home today.  She should have a follow up  appointment  with Dr. Felipa Eth within 2 weeks following discharge and was  advised to call [phone] to schedule that appointment.  She should have  a followup appointment with Dr. Dorena Cookey per his recommendations.           ______________________________  Barry Dienes Eloise Harman, M.D.     DGP/MEDQ  D:  01/10/2008  T:  01/10/2008  Job:  [id number]   cc:   Larina Earthly, M.D.  Almedia Balls. Ranell Patrick, M.D.  Di Kindle. Edilia Bo, M.D.

## 2010-05-25 NOTE — Op Note (Signed)
NAMEHonour, [name]                 ACCOUNT NO.:  [id number]   MEDICAL RECORD NO.:  [id number]          PATIENT TYPE:  INP   LOCATION:  5118                         FACILITY:  [facility]   PHYSICIAN:  John C. Madilyn Fireman, M.D.    DATE OF BIRTH:  [birth year]   DATE OF PROCEDURE:  01/10/2008  DATE OF DISCHARGE:  01/10/2008                               OPERATIVE REPORT   INDICATIONS FOR PROCEDURE:  Anemia with documented several grams drop in  hemoglobin in a patient with history of recurrent episodes of ischemic  colitis.   PROCEDURE:  The patient was placed in the left lateral decubitus  position and placed on the pulse monitor with continuous low-flow oxygen  delivered by nasal cannula.  She was sedated with 50 mcg IV fentanyl and  5 mg IV Versed in addition to the medicine given for the previous EGD.  The Olympus video colonoscope was inserted into the rectum and advanced  to the cecum, confirmed by transillumination of McBurney point and  visualization of ileocecal valve and appendiceal orifice.  The prep was  good.  The cecum, ascending, transverse, and descending colon all  appeared normal with no masses, polyps, diverticula or other mucosal  abnormalities.  There were scant diverticula noted in the sigmoid colon,  but no visible suggestion of ischemia or colitis.  The rectum appeared  normal and the retroflexed view of the anus revealed no obvious internal  hemorrhoids.  The scope was then withdrawn and the patient returned to  the recovery room in stable condition.  She tolerated the procedure  well.  There were no immediate complications.   IMPRESSION:  Mild left-sided diverticulosis, otherwise normal study.   PLAN:  Expectant management and follow hemoglobin and Hemoccults as an  outpatient.           ______________________________  Everardo All. Madilyn Fireman, M.D.     JCH/MEDQ  D:  01/10/2008  T:  01/10/2008  Job:  [id number]   cc:   Barry Dienes. Eloise Harman, M.D.

## 2010-05-28 NOTE — Consult Note (Signed)
NAMEDaisee, [name]                 ACCOUNT NO.:  [id number]   MEDICAL RECORD NO.:  [id number]          PATIENT TYPE:  EMS   LOCATION:  ED                           FACILITY:  [facility]   PHYSICIAN:  Leonides Grills, M.D.     DATE OF BIRTH:  1929/04/23   DATE OF CONSULTATION:  02/06/2005  DATE OF DISCHARGE:                                   CONSULTATION   CHIEF COMPLAINT:  Right shoulder pain.   HISTORY:  This is a 77 year old female who slipped and fell at home and hit  the anterior aspect of her shoulder the bed frame and had immediate pain and  inability to move her right upper extremity. She has no numbness or tingling  distal to her shoulder. She was then taken to [facility] ER where x-rays  were obtained and I was consulted for further evaluation and treatment.   PAST MEDICAL HISTORY:  Please refer to Lianne Cure' note.   PHYSICAL EXAMINATION:  Axillary nerve was intact to palpation and sensation  was equal bilaterally. Palpable radial and ulnar pulses and sensation intact  to light touch over her fingertips equal bilaterally. She kept her shoulder  and externally rotated guarded position at the side of her thorax.   X-RAYS:  Three views of the shoulder show an anterior, inferior dislocated  right glenohumeral joint with likely bony __________ and a greater  tuberosity fracture.   DESCRIPTION OF PROCEDURE:  Under conscious sedation with Dilaudid and 2 mg  of Versed and her sat never dipped below 95 throughout the procedure, a  gentle closed reduction was performed of the right glenohumeral joint. She  tolerated this very well. Post reduction, she had excellent range of motion  of her shoulder with significantly decreased discomfort. Post reduction x-  rays show a greater tuberosity fracture with located glenohumeral joint.   PLAN:  We will obtain a CT scan of her right shoulder __________ region,  especially evaluate the greater tuberosity and bony Bankart and see if there  is any incarcerated fragment within the glenohumeral joint as well. She is  to followup with Beverely Low, our shoulder specialist, for evaluation and  treatment of this. She was placed in a shoulder sling today and she is  instructed not to perform any abduction, external rotation of her shoulder  at this time as well until otherwise instructed by Dr. Ranell Patrick. We went over  this in great detail and all questions were encouraged and answered.      Leonides Grills, M.D.  Electronically Signed     PB/MEDQ  D:  02/06/2005  T:  02/07/2005  Job:  [id number]

## 2010-05-28 NOTE — Op Note (Signed)
Sheri Sheri, Sheri Sheri                 ACCOUNT NO.:  [id number]   MEDICAL RECORD NO.:  [id number]          PATIENT TYPE:  AMB   LOCATION:  SDS                          FACILITY:  [facility]   PHYSICIAN:  Almedia Balls. Ranell Patrick, M.D. DATE OF BIRTH:  June 20, 1929   DATE OF PROCEDURE:  02/11/2005  DATE OF DISCHARGE:                                 OPERATIVE REPORT   PREOPERATIVE DIAGNOSES:  Right shoulder dislocation with greater tuberosity  fracture/displaced.   POSTOPERATIVE DIAGNOSES:  Right shoulder dislocation, greater tuberosity  fracture and rotator cuff tear.   PROCEDURE PERFORMED:  Right shoulder greater tuberosity excision with  rotator cuff repair.   ATTENDING SURGEON:  Almedia Balls. Ranell Patrick, M.D.   ASSISTANT:  Donnie Coffin. Durwin Nora, P.A.   General anesthesia was used.  Estimated blood loss was minimal.  Fluid  replacement 1200 mL crystalloid.  Instrument count was correct.  No  complications.  Perioperative antibiotics given.   INDICATIONS:  The patient is a 77 year old female with a history of a right  shoulder fracture-dislocation after a fall.  The patient's greater  tuberosity remained displaced following her closed reduction in the  emergency room.  The patient presented to orthopedics with weakness and  functional loss as well as pain secondary to her greater tuberosity fracture  and suspected rotator cuff tear.  After counseling the patient regarding  options for management to include conservative management versus surgical  treatment, the patient and family elected to proceed with surgery in order  to restore rotator cuff integrity and also to remove the displaced greater  tuberosity fragment from the subacromial space.  Informed consent was  obtained.   DESCRIPTION OF OPERATION:  After an adequate level of anesthesia was  achieved, the patient was positioned in the modified beach-chair position,  the right shoulder was sterilely prepped and draped in the usual manner.  We  did a  deltoid-splitting incision in Langer's lines.  Dissection was carried  sharply down through subcutaneous tissues into the deltoid.  We identified  the raphe between the anterior and lateral heads, split that, and then  identified the greater tuberosity bed where the fracture fragment had come  from.  We went ahead and irrigated that out.  This was just lateral to the  bicipital groove.  We identified the fracture fragments.  The tuberosity was  not in one piece but was in multiple small pieces.  They were not  contiguous, so we went ahead and removed those pieces so they would not  become loose or become an impinging mass.  Next we went ahead and placed  three modified Mason-Allen #2 Fibrewire sutures into the free edge of the  rotator cuff, which appeared to be in reasonably good condition, to give a  total of six strands coming out of the free edge of the tendon.  Next we  placed a total of two 5.5 biocorkscrew anchors adjacent to the articular  cartilage, one of them in the crater from the greater tuberosity, the other  one actually in the rotator cuff footprint.  A real good purchase from the  posterior anchor, average purchase of the anterior anchor, placed the two #2  Fibrewire sutures up through the tendon in the appropriate position in a  horizontal mattress fashion, and once we had done that we went in and pulled  the other sutures from the free end down and placed them through the  remaining shaft of the humerus, through the greater tuberosity bed.  This  pulled the cuff down nicely.  We went ahead and tied all those sutures.  We  had a nice, low-profile repair and good bone bridges.  We went ahead and  thoroughly irrigated the wound, closed the deltoid with interrupted 0 Vicryl  sutures in buried  knots, followed by 2-0 Vicryl for the subcutaneous tissue and 4-0 running  Monocryl for skin.  Steri-Strips, sterile dressing and an abduction brace  immobilizer for the shoulder.  The  patient was taken to the recovery room in  stable condition.           ______________________________  Almedia Balls Ranell Patrick, M.D.     SRN/MEDQ  D:  02/11/2005  T:  02/12/2005  Job:  [id number]

## 2010-07-20 ENCOUNTER — Telehealth: Payer: Self-pay | Admitting: Cardiology

## 2010-07-20 NOTE — Telephone Encounter (Signed)
Left message

## 2010-07-20 NOTE — Telephone Encounter (Signed)
Called wanting to schedule an appointment from her recall letter and would like it in a friday. Please call back. I have pulled her chart.

## 2010-07-23 NOTE — Telephone Encounter (Signed)
Appointment scheduled by front office

## 2010-08-06 ENCOUNTER — Ambulatory Visit: Payer: Self-pay | Admitting: Nurse Practitioner

## 2010-08-25 ENCOUNTER — Encounter: Payer: Self-pay | Admitting: Nurse Practitioner

## 2010-09-03 ENCOUNTER — Encounter: Payer: Self-pay | Admitting: Nurse Practitioner

## 2010-09-03 ENCOUNTER — Ambulatory Visit (INDEPENDENT_AMBULATORY_CARE_PROVIDER_SITE_OTHER): Payer: Medicare Other | Admitting: Nurse Practitioner

## 2010-09-03 VITALS — BP 148/58 | HR 76 | Ht 65.0 in | Wt 190.4 lb

## 2010-09-03 DIAGNOSIS — J4489 Other specified chronic obstructive pulmonary disease: Secondary | ICD-10-CM

## 2010-09-03 DIAGNOSIS — Z8719 Personal history of other diseases of the digestive system: Secondary | ICD-10-CM

## 2010-09-03 DIAGNOSIS — I38 Endocarditis, valve unspecified: Secondary | ICD-10-CM

## 2010-09-03 DIAGNOSIS — I10 Essential (primary) hypertension: Secondary | ICD-10-CM

## 2010-09-03 DIAGNOSIS — J449 Chronic obstructive pulmonary disease, unspecified: Secondary | ICD-10-CM

## 2010-09-03 DIAGNOSIS — R0789 Other chest pain: Secondary | ICD-10-CM

## 2010-09-03 LAB — CBC WITH DIFFERENTIAL/PLATELET
Basophils Absolute: 0 10*3/uL (ref 0.0–0.1)
Basophils Relative: 0.6 % (ref 0.0–3.0)
Eosinophils Absolute: 0.1 10*3/uL (ref 0.0–0.7)
Eosinophils Relative: 1.6 % (ref 0.0–5.0)
HCT: 41.6 % (ref 36.0–46.0)
Hemoglobin: 13.9 g/dL (ref 12.0–15.0)
Lymphocytes Relative: 31.3 % (ref 12.0–46.0)
Lymphs Abs: 2.5 10*3/uL (ref 0.7–4.0)
MCHC: 33.4 g/dL (ref 30.0–36.0)
MCV: 97.6 fl (ref 78.0–100.0)
Monocytes Absolute: 0.7 10*3/uL (ref 0.1–1.0)
Monocytes Relative: 8.1 % (ref 3.0–12.0)
Neutro Abs: 4.8 10*3/uL (ref 1.4–7.7)
Neutrophils Relative %: 58.4 % (ref 43.0–77.0)
Platelets: 323 10*3/uL (ref 150.0–400.0)
RBC: 4.26 Mil/uL (ref 3.87–5.11)
RDW: 13.3 % (ref 11.5–14.6)
WBC: 8.1 10*3/uL (ref 4.5–10.5)

## 2010-09-03 LAB — CK TOTAL AND CKMB (NOT AT ARMC)
CK, MB: 2.7 ng/mL (ref 0.3–4.0)
Total CK: 62 U/L (ref 7–177)

## 2010-09-03 NOTE — Assessment & Plan Note (Signed)
She had her echo in January. Will continue to follow medically.

## 2010-09-03 NOTE — Assessment & Plan Note (Addendum)
Will check an EKG today and cardiac enzymes. She has had a basically negative cath just 2 years ago. She has fresh NTG on hand. If she has recurrence she is to let us know. I have discussed her case with Dr. [name] and he is in agreement.   EKG shows sinus with ST and T wave changes. EKG is unchanged today from prior tracing and is reviewed with Dr. [name].

## 2010-09-03 NOTE — Assessment & Plan Note (Signed)
She is to monitor at home.

## 2010-09-03 NOTE — Progress Notes (Signed)
[name] Date of Birth: May 27, 1929   History of Present Illness: [name] is seen back today for her 6 month check. She is seen for Dr. [name]. She says she is doing "great". Her rhythm has been good. She is not smoking. She is not dizzy. She has spent 2 months at the beach and did well. Her only issue is that she had discomfort between her shoulder blades all night this past Wednesday. She slept poorly. No other symptoms. She was not short of breath. She did not have chest pain. She has had her gallbladder out. On Thursday morning she took some of her daughter's NTG (it was not fresh) and it eased on off. She took a second one about one hour later and has had no further problems. She did not get prompt relief but more of a gradual relief. She has had no recurrence.  Current Outpatient Prescriptions on File Prior to Visit  Medication Sig Dispense Refill  . amitriptyline (ELAVIL) 50 MG tablet Take 50 mg by mouth at bedtime.        Marland Kitchen aspirin 81 MG tablet Take 81 mg by mouth daily.        Marland Kitchen atorvastatin (LIPITOR) 20 MG tablet Take 20 mg by mouth daily.        . Furosemide (LASIX PO) Take by mouth daily.        Marland Kitchen guaiFENesin (MUCINEX) 600 MG 12 hr tablet Take 1,200 mg by mouth as needed.       Marland Kitchen lisinopril (PRINIVIL,ZESTRIL) 10 MG tablet Take 10 mg by mouth daily.        . nitroGLYCERIN (NITROSTAT) 0.4 MG SL tablet Place 0.4 mg under the tongue every 5 (five) minutes as needed.        Marland Kitchen omeprazole (PRILOSEC) 20 MG capsule Take 20 mg by mouth daily.        Marland Kitchen tiotropium (SPIRIVA) 18 MCG inhalation capsule Place 18 mcg into inhaler and inhale daily as needed.        . verapamil (COVERA HS) 240 MG (CO) 24 hr tablet Take 240 mg by mouth at bedtime.          No Known Allergies  Past Medical History  Diagnosis Date  . Hypertension   . COPD (chronic obstructive pulmonary disease)   . Aortic insufficiency     Mild to moderate per echo in Jan 2012  . Dizziness   . PAF (paroxysmal atrial  fibrillation)   . History of GI bleed   . Anemia   . DOE (dyspnea on exertion)   . Chest pain     Minimal disease per cath in 2010  . Mild aortic stenosis     Per echo in Jan 2012    Past Surgical History  Procedure Date  . Cardiac catheterization 03/10/2008    EF 65%; Minimal luminal irregularities with no obstructive lesions  . Cholecystectomy   . Hernia repair   . Transthoracic echocardiogram 02/05/2010    EF 55-65%; Mild AS, mild to moderate AI  . Cardiovascular stress test 02/29/2008    EF 60%    History  Smoking status  . Former Smoker  . Quit date: 01/11/2008  Smokeless tobacco  . Not on file    History  Alcohol Use No    Family History  Problem Relation Age of Onset  . Heart attack Father     Review of Systems: The review of systems is as above. Overall, she says she is  doing better overall.  All other systems were reviewed and are negative.  Physical Exam: BP 148/58  Pulse 76  Ht 5\' 5"  (1.651 m)  Wt 190 lb 6.4 oz (86.365 kg)  BMI 31.68 kg/m2 Patient is very pleasant and in no acute distress. Skin is warm and dry. Color is normal.  HEENT is unremarkable. Normocephalic/atraumatic. PERRL. Sclera are nonicteric. Neck is supple. No masses. No JVD. Lungs are coarse. Cardiac exam shows a regular rate and rhythm. Abdomen is soft. Extremities are without edema. Gait and ROM are intact. No gross neurologic deficits noted.   LABORATORY DATA:   Assessment / Plan:

## 2010-09-03 NOTE — Patient Instructions (Signed)
Stay on your current medicines Let us know if you have any more spells of discomfort We will see you back in 6 months We are going to check some labs today Call for any problems.

## 2010-09-03 NOTE — Assessment & Plan Note (Signed)
Will check CBC today as well.

## 2010-09-03 NOTE — Assessment & Plan Note (Signed)
Not smoking. No shortness of breath reported.

## 2010-09-04 LAB — TROPONIN I: Troponin I: <0.01 ng/mL (ref <0.06)

## 2010-09-06 ENCOUNTER — Telehealth: Payer: Self-pay | Admitting: *Deleted

## 2010-09-06 NOTE — Telephone Encounter (Signed)
Message copied by Eugenia Pancoast on Mon Sep 06, 2010 11:08 AM ------      Message from: [name]      Created: Fri Sep 03, 2010  4:48 PM       Ok to report. CBC is satisfactory.

## 2010-09-06 NOTE — Telephone Encounter (Signed)
Message copied by Eugenia Pancoast on Mon Sep 06, 2010 10:57 AM ------      Message from: Rosalio Macadamia      Created: Mon Sep 06, 2010  8:03 AM       Ok to report. Labs are satisfactory.

## 2010-09-06 NOTE — Telephone Encounter (Signed)
Advised daughter of labs 

## 2010-10-13 LAB — DIFFERENTIAL
Eosinophils Absolute: 0
Eosinophils Relative: 0
Lymphs Abs: 2.4

## 2010-10-13 LAB — CBC
MCHC: 33.3
MCV: 97.8
Platelets: 287
RBC: 4.33
RDW: 13.1

## 2010-10-13 LAB — BASIC METABOLIC PANEL
BUN: 12
CO2: 26
Chloride: 105
Creatinine, Ser: 0.81
Glucose, Bld: 115 — ABNORMAL HIGH

## 2010-10-13 LAB — POCT I-STAT 3, ART BLOOD GAS (G3+)
Acid-base deficit: 1
O2 Saturation: 92
pO2, Arterial: 66 — ABNORMAL LOW

## 2010-10-13 LAB — CK: Total CK: 61

## 2010-10-13 LAB — TROPONIN I: Troponin I: 0.01

## 2010-10-15 LAB — POCT CARDIAC MARKERS
Myoglobin, poc: 32.6 ng/mL (ref 12–200)
Troponin i, poc: <0.05 ng/mL (ref 0.00–0.09)

## 2010-10-15 LAB — RETICULOCYTES
RBC.: 2.63 MIL/uL — ABNORMAL LOW (ref 3.87–5.11)
Retic Count, Absolute: 121 10*3/uL (ref 19.0–186.0)
Retic Ct Pct: 4.6 % — ABNORMAL HIGH (ref 0.4–3.1)

## 2010-10-15 LAB — URINALYSIS, ROUTINE W REFLEX MICROSCOPIC
Bilirubin Urine: NEGATIVE
Hgb urine dipstick: NEGATIVE
Protein, ur: 30 mg/dL — AB
Urobilinogen, UA: 0.2 mg/dL (ref 0.0–1.0)

## 2010-10-15 LAB — POCT I-STAT 3, ART BLOOD GAS (G3+)
TCO2: 27 mmol/L (ref 0–100)
pH, Arterial: 7.374 (ref 7.350–7.400)

## 2010-10-15 LAB — CROSSMATCH

## 2010-10-15 LAB — URINE MICROSCOPIC-ADD ON

## 2010-10-15 LAB — BASIC METABOLIC PANEL
BUN: 12 mg/dL (ref 6–23)
BUN: 14 mg/dL (ref 6–23)
CO2: 22 mEq/L (ref 19–32)
Chloride: 107 mEq/L (ref 96–112)
Chloride: 111 mEq/L (ref 96–112)
Creatinine, Ser: 0.51 mg/dL (ref 0.4–1.2)
GFR calc non Af Amer: >60 mL/min (ref >60)
Glucose, Bld: 124 mg/dL — ABNORMAL HIGH (ref 70–99)
Glucose, Bld: 156 mg/dL — ABNORMAL HIGH (ref 70–99)
Potassium: 3.8 mEq/L (ref 3.5–5.1)
Potassium: 4.6 mEq/L (ref 3.5–5.1)

## 2010-10-15 LAB — CBC
HCT: 23.5 % — ABNORMAL LOW (ref 36.0–46.0)
HCT: 29.5 % — ABNORMAL LOW (ref 36.0–46.0)
MCHC: 32.3 g/dL (ref 30.0–36.0)
MCV: 93.5 fL (ref 78.0–100.0)
MCV: 95 fL (ref 78.0–100.0)
Platelets: 261 10*3/uL (ref 150–400)
Platelets: 299 10*3/uL (ref 150–400)
RDW: 14.6 % (ref 11.5–15.5)

## 2010-10-15 LAB — IRON AND TIBC: TIBC: 337 ug/dL (ref 250–470)

## 2010-10-15 LAB — DIFFERENTIAL
Eosinophils Absolute: 0.2 10*3/uL (ref 0.0–0.7)
Eosinophils Relative: 2 % (ref 0–5)
Lymphs Abs: 2.2 10*3/uL (ref 0.7–4.0)
Monocytes Relative: 9 % (ref 3–12)

## 2010-10-15 LAB — CLOTEST (H. PYLORI), BIOPSY: Helicobacter screen: NEGATIVE

## 2010-10-15 LAB — FOLATE: Folate: 14.1 ng/mL

## 2011-01-04 IMAGING — CR DG ABDOMEN ACUTE W/ 1V CHEST
2 series · 2 of 2 positions shown · non-contrast
Comparison: PA and lateral chest 10/31/2008.

CLINICAL DATA: Shortness of breath and weakness.  History of bowel
obstruction.

ACUTE ABDOMEN SERIES (ABDOMEN 2 VIEW & CHEST 1 VIEW)

[w abdomen decub]
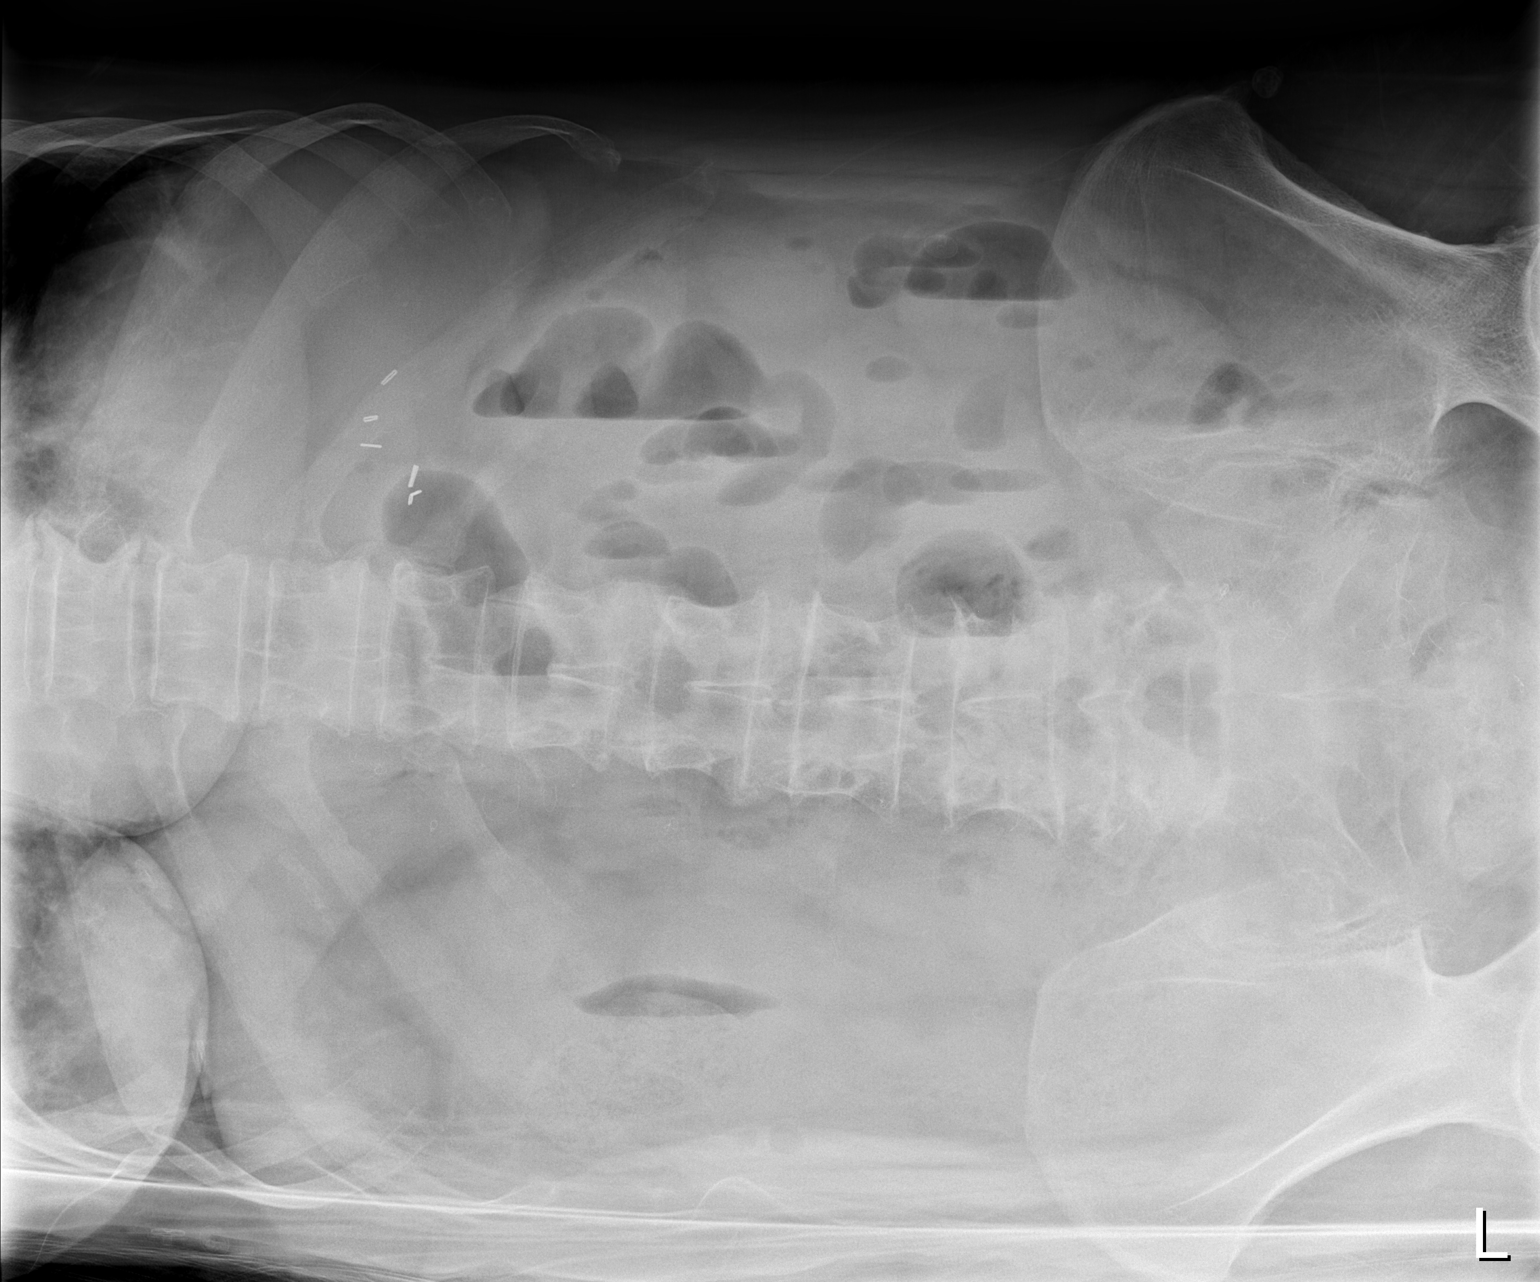

[t abdomen supine]
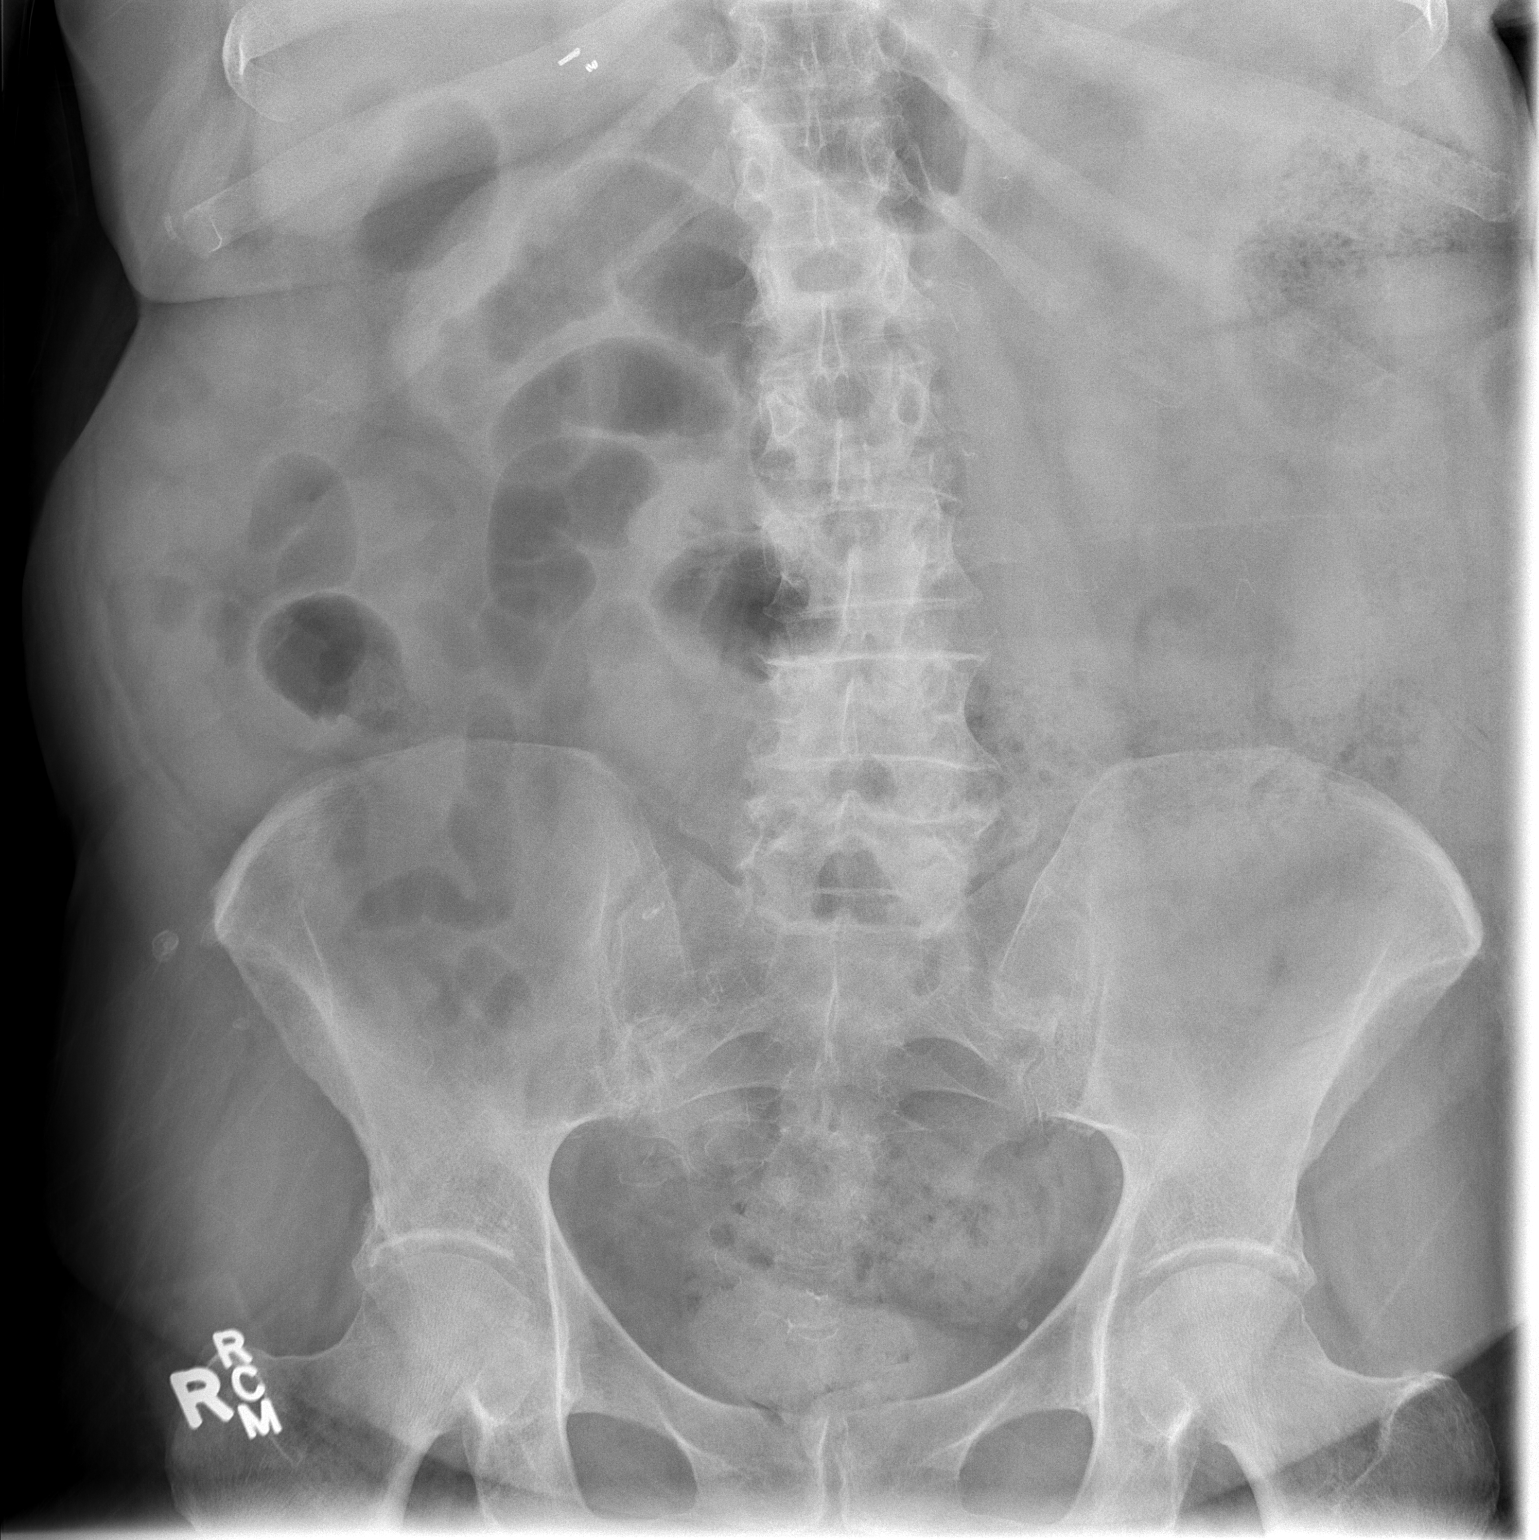

[2 of 2 positions shown; findings below may reference images not displayed]

FINDINGS: Single view of the chest and shows clear lungs.  Heart
size upper normal.  No effusion.

Two views of the abdomen show no free intraperitoneal air.  There
is no evidence of small bowel obstruction with a nonspecific,
nonobstructive pattern noted.  Suture material projects over the
low pelvis.
IMPRESSION: No acute finding chest or abdomen.

## 2011-02-16 ENCOUNTER — Other Ambulatory Visit: Payer: Self-pay

## 2011-02-16 ENCOUNTER — Emergency Department (HOSPITAL_COMMUNITY)
Admission: EM | Admit: 2011-02-16 | Discharge: 2011-02-16 | Disposition: A | Discharge status: Home / Self Care | Payer: Medicare Other | Attending: Emergency Medicine | Admitting: Emergency Medicine

## 2011-02-16 ENCOUNTER — Emergency Department (HOSPITAL_COMMUNITY): Payer: Medicare Other

## 2011-02-16 ENCOUNTER — Encounter (HOSPITAL_COMMUNITY): Payer: Self-pay

## 2011-02-16 DIAGNOSIS — R000 Tachycardia, unspecified: Secondary | ICD-10-CM | POA: Insufficient documentation

## 2011-02-16 DIAGNOSIS — J449 Chronic obstructive pulmonary disease, unspecified: Secondary | ICD-10-CM | POA: Insufficient documentation

## 2011-02-16 DIAGNOSIS — R079 Chest pain, unspecified: Secondary | ICD-10-CM | POA: Insufficient documentation

## 2011-02-16 DIAGNOSIS — R51 Headache: Secondary | ICD-10-CM | POA: Insufficient documentation

## 2011-02-16 DIAGNOSIS — I4891 Unspecified atrial fibrillation: Secondary | ICD-10-CM | POA: Insufficient documentation

## 2011-02-16 DIAGNOSIS — R5381 Other malaise: Secondary | ICD-10-CM | POA: Insufficient documentation

## 2011-02-16 DIAGNOSIS — R5383 Other fatigue: Secondary | ICD-10-CM | POA: Insufficient documentation

## 2011-02-16 DIAGNOSIS — I10 Essential (primary) hypertension: Secondary | ICD-10-CM | POA: Insufficient documentation

## 2011-02-16 DIAGNOSIS — R002 Palpitations: Secondary | ICD-10-CM | POA: Insufficient documentation

## 2011-02-16 DIAGNOSIS — J4489 Other specified chronic obstructive pulmonary disease: Secondary | ICD-10-CM | POA: Insufficient documentation

## 2011-02-16 LAB — BASIC METABOLIC PANEL
BUN: 12 mg/dL (ref 6–23)
Chloride: 107 mEq/L (ref 96–112)
Creatinine, Ser: 0.55 mg/dL (ref 0.50–1.10)
GFR calc Af Amer: >90 mL/min (ref >90)
GFR calc non Af Amer: 87 mL/min — ABNORMAL LOW (ref >90)
Potassium: 3.4 mEq/L — ABNORMAL LOW (ref 3.5–5.1)

## 2011-02-16 LAB — CARDIAC PANEL(CRET KIN+CKTOT+MB+TROPI)
CK, MB: 2.8 ng/mL (ref 0.3–4.0)
CK, MB: 2.7 ng/mL (ref 0.3–4.0)
Relative Index: INVALID (ref 0.0–2.5)
Total CK: 62 U/L (ref 7–177)
Troponin I: <0.3 ng/mL (ref <0.30)

## 2011-02-16 LAB — DIFFERENTIAL
Basophils Absolute: 0 10*3/uL (ref 0.0–0.1)
Basophils Relative: 0 % (ref 0–1)
Eosinophils Absolute: 0.1 10*3/uL (ref 0.0–0.7)
Monocytes Absolute: 0.7 10*3/uL (ref 0.1–1.0)
Monocytes Relative: 9 % (ref 3–12)
Neutro Abs: 4.8 10*3/uL (ref 1.7–7.7)
Neutrophils Relative %: 60 % (ref 43–77)

## 2011-02-16 LAB — URINALYSIS, ROUTINE W REFLEX MICROSCOPIC
Glucose, UA: NEGATIVE mg/dL
Hgb urine dipstick: NEGATIVE
Ketones, ur: NEGATIVE mg/dL
Protein, ur: NEGATIVE mg/dL
Urobilinogen, UA: 0.2 mg/dL (ref 0.0–1.0)

## 2011-02-16 LAB — CBC
MCH: 31 pg (ref 26.0–34.0)
MCHC: 31.6 g/dL (ref 30.0–36.0)
RDW: 12.8 % (ref 11.5–15.5)

## 2011-02-16 LAB — PROTIME-INR: Prothrombin Time: 12.7 seconds (ref 11.6–15.2)

## 2011-02-16 MED ORDER — DILTIAZEM HCL 100 MG IV SOLR
5.0000 mg/h | Freq: Once | INTRAVENOUS | Status: DC
  Filled 2011-02-16: qty 100

## 2011-02-16 MED ORDER — VERAPAMIL HCL ER 120 MG PO TBCR
120.0000 mg | EXTENDED_RELEASE_TABLET | ORAL | Status: AC
  Administered 2011-02-16: 120 mg via ORAL
  Filled 2011-02-16: qty 1

## 2011-02-16 MED ORDER — VERAPAMIL HCL ER 120 MG PO TBCR: EXTENDED_RELEASE_TABLET | ORAL | Status: DC

## 2011-02-16 MED ORDER — SODIUM CHLORIDE 0.9 % IV SOLN
Freq: Once | INTRAVENOUS | Status: AC
  Administered 2011-02-16: 19:00:00 via INTRAVENOUS

## 2011-02-16 MED ORDER — VERAPAMIL HCL ER 360 MG PO CP24: 360.0000 mg | ORAL_CAPSULE | Freq: Every day | ORAL | Status: DC

## 2011-02-16 NOTE — ED Notes (Signed)
EDP notified pt in SR, EKG obtained. Dr. Manus Gunning reporting hold off on for now

## 2011-02-16 NOTE — ED Notes (Signed)
Pt has hx afib. Pt reports today started having CP and SOB. Took 3 SL nitro. Became weak while getting out of chair. EMS reported AFIB on monitor. Pt reporting can't take blood thinners or aspirin due to bleeding risk. At this time pt denying any CP. Reporting SOb. Alert and oriented. Vitals stable. Pt in SR at this time. Skin warm and dry. Denying any N/v or dizziness. Pt reporting feeling weak.

## 2011-02-16 NOTE — ED Notes (Signed)
Pt presents with 2-3 day h/o weakness that worsened this morning.  Pt reports headache and shortness of breath.  Pt reports "heaviness" in chest.  PIV to L AC is 20g with NS infusing.  EKG showed AFIB with RVR with HR as high as 200.

## 2011-02-16 NOTE — ED Provider Notes (Signed)
History     CSN: [id number]  Arrival date & time 02/16/11  1744   First MD Initiated Contact with Patient 02/16/11 1747      Chief Complaint  Patient presents with  . Atrial Fibrillation    pcp card is brackbill    (Consider location/radiation/quality/duration/timing/severity/associated sxs/prior treatment) HPI Comments: Patient reports generalized weakness, palpitations that have been intermittent all day. She also had an episode of chest heaviness around 3 PM that resolved with nitroglycerin. She was found to be tachycardic as high as 200 with A. fib with RVR EMS arrival. She does have a history of a true fibrillation. She is not on anticoagulation due to history of GI bleed. Now complains of a headache after the nitroglycerin. She did not have any chest pain currently.  Denies leg pain or swelling, nausea.  The history is provided by the patient.    Past Medical History  Diagnosis Date  . Hypertension   . COPD (chronic obstructive pulmonary disease)   . Aortic insufficiency     Mild to moderate per echo in Jan 2012  . Dizziness   . PAF (paroxysmal atrial fibrillation)   . History of GI bleed   . Anemia   . DOE (dyspnea on exertion)   . Chest pain     Minimal disease per cath in 2010  . Mild aortic stenosis     Per echo in Jan 2012    Past Surgical History  Procedure Date  . Cardiac catheterization 03/10/2008    EF 65%; Minimal luminal irregularities with no obstructive lesions  . Cholecystectomy   . Hernia repair   . Transthoracic echocardiogram 02/05/2010    EF 55-65%; Mild AS, mild to moderate AI  . Cardiovascular stress test 02/29/2008    EF 60%    Family History  Problem Relation Age of Onset  . Heart attack Father     History  Substance Use Topics  . Smoking status: Former Smoker    Quit date: 01/11/2008  . Smokeless tobacco: Not on file  . Alcohol Use: No    OB History    Grav Para Term Preterm Abortions TAB SAB Ect Mult Living                   Review of Systems  Constitutional: Negative for fever, chills and fatigue.  HENT: Negative for congestion and rhinorrhea.   Respiratory: Positive for chest tightness and shortness of breath.   Cardiovascular: Positive for chest pain and palpitations. Negative for leg swelling.  Gastrointestinal: Negative for nausea, vomiting and abdominal pain.  Genitourinary: Negative for dysuria and hematuria.  Musculoskeletal: Negative for myalgias and back pain.  Skin: Negative for rash.  Neurological: Positive for dizziness, light-headedness and headaches.    Allergies  Amoxicillin  Home Medications   Current Outpatient Rx  Name Route Sig Dispense Refill  . ALBUTEROL SULFATE HFA 108 (90 BASE) MCG/ACT IN AERS Inhalation Inhale 2 puffs into the lungs every 6 (six) hours as needed. For shortness of breath.    . AMITRIPTYLINE HCL 50 MG PO TABS Oral Take 50 mg by mouth at bedtime.      . ASPIRIN 81 MG PO TABS Oral Take 81 mg by mouth daily.      . ATORVASTATIN CALCIUM 20 MG PO TABS Oral Take 20 mg by mouth daily.      . FUROSEMIDE 20 MG PO TABS Oral Take 20 mg by mouth daily.    . GUAIFENESIN ER 600 MG PO  TB12 Oral Take 1,200 mg by mouth as needed. For cough/congestion.    . NEBIVOLOL HCL 10 MG PO TABS Oral Take 10 mg by mouth daily.    Marland Kitchen NITROGLYCERIN 0.4 MG SL SUBL Sublingual Place 0.4 mg under the tongue every 5 (five) minutes as needed. For chest pain.    Marland Kitchen OMEPRAZOLE 20 MG PO CPDR Oral Take 20 mg by mouth daily.      Marland Kitchen PREDNISONE 20 MG PO TABS Oral Take 20 mg by mouth daily as needed. Takes if having breathing issues.    Marland Kitchen TIOTROPIUM BROMIDE MONOHYDRATE 18 MCG IN CAPS Inhalation Place 18 mcg into inhaler and inhale daily as needed. For COPD.    Marland Kitchen VERAPAMIL HCL ER (CO) 240 MG PO TB24 Oral Take 240 mg by mouth at bedtime.        BP 116/93  Pulse 60  Temp(Src) 98.2 F (36.8 C) (Oral)  Resp 15  Ht 5\' 4"  (1.626 m)  Wt 185 lb (83.915 kg)  BMI 31.76 kg/m2  SpO2 99%  Physical Exam   Constitutional: She is oriented to person, place, and time. She appears well-developed and well-nourished. No distress.  HENT:  Head: Normocephalic and atraumatic.  Mouth/Throat: Oropharynx is clear and moist. No oropharyngeal exudate.  Eyes: Conjunctivae are normal. Pupils are equal, round, and reactive to light.  Neck: Normal range of motion. Neck supple.  Cardiovascular: Normal rate, regular rhythm and normal heart sounds.   Pulmonary/Chest: Effort normal and breath sounds normal. No respiratory distress.  Abdominal: Soft. There is no tenderness. There is no rebound and no guarding.  Musculoskeletal: Normal range of motion. She exhibits no edema and no tenderness.  Neurological: She is alert and oriented to person, place, and time. No cranial nerve deficit.  Skin: Skin is warm.    ED Course  Procedures (including critical care time)  Labs Reviewed  BASIC METABOLIC PANEL - Abnormal; Notable for the following:    Sodium 146 (*)    Potassium 3.4 (*)    Glucose, Bld 110 (*)    GFR calc non Af Amer 87 (*)    All other components within normal limits  CBC  DIFFERENTIAL  PROTIME-INR  URINALYSIS, ROUTINE W REFLEX MICROSCOPIC  CARDIAC PANEL(CRET KIN+CKTOT+MB+TROPI)  CARDIAC PANEL(CRET KIN+CKTOT+MB+TROPI)   Dg Chest Portable 1 View  02/16/2011  *RADIOLOGY REPORT*  Clinical Data: Chest pain and weakness  PORTABLE CHEST - 1 VIEW  Comparison: 10/31/2008  Findings: Left infrahilar atelectasis or scarring stable.  Mildly prominent bibasilar interstitial markings without confluent airspace infiltrate or overt edema.  No definite effusion.  Heart size normal.  Atheromatous aortic arch.  IMPRESSION: 1.  Chronic parenchymal changes.  No acute disease.  Original Report Authenticated By: Osa Craver, M.D.     No diagnosis found.    MDM  Palpitations, A. fib with RVR, chest heaviness and shortness of breath with nausea. Patient no distress right now.  Patient spontaneously  converted back to sinus rhythm. She has no further chest pain or palpitations. Her blood pressure stable.  I discussed her case with Dr. wall who is on call for Dr. [name].  He recommends giving the patient an additional 120 mg of verapamil and increasing her dose to 360 daily.  He will arrange followup in clinic on Friday.  Delta troponin negative. Patient no further episodes of A. fib or tachycardia.  No chest pressure or palpitations.   Date: 02/16/2011  Rate: 114  Rhythm: atrial flutter  QRS Axis: normal  Intervals: normal  ST/T Wave abnormalities: nonspecific ST/T changes  Conduction Disutrbances:left bundle branch block  Narrative Interpretation: new left bundle branch block, no apparent P waves  Old EKG Reviewed: changes noted     Date: 02/16/2011  Rate: 65  Rhythm: normal sinus rhythm  QRS Axis: normal  Intervals: normal  ST/T Wave abnormalities: nonspecific ST/T changes  Conduction Disutrbances:none  Narrative Interpretation:   Old EKG Reviewed: changes noted  CRITICAL CARE Performed by: Glynn Octave   Total critical care time: 30  Critical care time was exclusive of separately billable procedures and treating other patients.  Critical care was necessary to treat or prevent imminent or life-threatening deterioration.  Critical care was time spent personally by me on the following activities: development of treatment plan with patient and/or surrogate as well as nursing, discussions with consultants, evaluation of patient's response to treatment, examination of patient, obtaining history from patient or surrogate, ordering and performing treatments and interventions, ordering and review of laboratory studies, ordering and review of radiographic studies, pulse oximetry and re-evaluation of patient's condition.       Glynn Octave, MD 02/16/11 2132

## 2011-02-16 NOTE — ED Notes (Signed)
Pt is on the bedside commode.  

## 2011-02-16 NOTE — ED Notes (Signed)
EKG done on arrival and given to Dr Manus Gunning with old EKG.

## 2011-02-18 ENCOUNTER — Telehealth: Payer: Self-pay | Admitting: Cardiology

## 2011-02-18 NOTE — Telephone Encounter (Signed)
New Problem   Patient was seen ER 2/6.  ER Physician instructed Increase your verapamil to 360 mg daily. .You are getting a new prescription for 120 mg tablets to add to your 240 mg tablets. Follow up with Dr. [name] on Friday.        Patient is concerned about new prescription for Verapamil and existing prescribed by Dr. [name]. Please return call to patient at hm#.   F/U sched with Dawayne Patricia. 02/22/11 @ 8am

## 2011-02-18 NOTE — Telephone Encounter (Signed)
Left message

## 2011-02-22 ENCOUNTER — Other Ambulatory Visit: Payer: Self-pay | Admitting: Nurse Practitioner

## 2011-02-22 ENCOUNTER — Ambulatory Visit (INDEPENDENT_AMBULATORY_CARE_PROVIDER_SITE_OTHER): Payer: Medicare Other | Admitting: Nurse Practitioner

## 2011-02-22 ENCOUNTER — Encounter: Payer: Self-pay | Admitting: Nurse Practitioner

## 2011-02-22 VITALS — BP 142/64 | HR 70 | Ht 64.5 in | Wt 188.0 lb

## 2011-02-22 DIAGNOSIS — R0789 Other chest pain: Secondary | ICD-10-CM

## 2011-02-22 DIAGNOSIS — I4891 Unspecified atrial fibrillation: Secondary | ICD-10-CM

## 2011-02-22 DIAGNOSIS — I10 Essential (primary) hypertension: Secondary | ICD-10-CM

## 2011-02-22 DIAGNOSIS — I38 Endocarditis, valve unspecified: Secondary | ICD-10-CM

## 2011-02-22 DIAGNOSIS — I350 Nonrheumatic aortic (valve) stenosis: Secondary | ICD-10-CM

## 2011-02-22 DIAGNOSIS — I480 Paroxysmal atrial fibrillation: Secondary | ICD-10-CM

## 2011-02-22 LAB — BASIC METABOLIC PANEL
BUN: 13 mg/dL (ref 6–23)
CO2: 31 mEq/L (ref 19–32)
Calcium: 9 mg/dL (ref 8.4–10.5)
Chloride: 104 mEq/L (ref 96–112)
Creatinine, Ser: 0.7 mg/dL (ref 0.4–1.2)
GFR: 90.05 mL/min (ref >60.00)
Glucose, Bld: 102 mg/dL — ABNORMAL HIGH (ref 70–99)
Potassium: 4.2 mEq/L (ref 3.5–5.1)
Sodium: 143 mEq/L (ref 135–145)

## 2011-02-22 LAB — TSH: TSH: 2.53 u[IU]/mL (ref 0.35–5.50)

## 2011-02-22 MED ORDER — LISINOPRIL 10 MG PO TABS: 20.0000 mg | ORAL_TABLET | Freq: Every day | ORAL | Status: DC

## 2011-02-22 MED ORDER — VERAPAMIL HCL ER 180 MG PO TBCR: 180.0000 mg | EXTENDED_RELEASE_TABLET | Freq: Two times a day (BID) | ORAL | Status: DC

## 2011-02-22 NOTE — Progress Notes (Addendum)
[name] Date of Birth: 1929-10-07 Medical Record #[id number]  History of Present Illness: [name] is seen back today for a post ER visit. She is seen for Dr. [name]. She had presented with chest pain and lightheadedness. Was noted to be in atrial fib at a rate of 200. Enzymes were negative. She converted spontaneously. Was sent home on additional Verapamil. Potassium was a little low. No recent TSH noted.   Since her ER visit, she has felt tired. Blood pressure is high at home. She is not really sure if she is having recurrent atrial fib. She will have some chest pains off and on. Not using any NTG. Has mild CAD per cath in 2010. Last echo was 13 months ago and she does have AS. She thinks overall she is better. She is currently taking 240 mg of Verapamil in the am and 180 mg in the evening.   Current Outpatient Prescriptions on File Prior to Visit  Medication Sig Dispense Refill  . albuterol (PROVENTIL HFA;VENTOLIN HFA) 108 (90 BASE) MCG/ACT inhaler Inhale 2 puffs into the lungs every 6 (six) hours as needed. For shortness of breath.      Marland Kitchen amitriptyline (ELAVIL) 50 MG tablet Take 50 mg by mouth at bedtime.        Marland Kitchen aspirin 81 MG tablet Take 81 mg by mouth daily.        Marland Kitchen atorvastatin (LIPITOR) 20 MG tablet Take 20 mg by mouth daily.        . furosemide (LASIX) 20 MG tablet Take 20 mg by mouth daily.      Marland Kitchen guaiFENesin (MUCINEX) 600 MG 12 hr tablet Take 1,200 mg by mouth as needed. For cough/congestion.      Marland Kitchen lisinopril (PRINIVIL,ZESTRIL) 10 MG tablet Take 2 tablets (20 mg total) by mouth daily.      . nitroGLYCERIN (NITROSTAT) 0.4 MG SL tablet Place 0.4 mg under the tongue every 5 (five) minutes as needed. For chest pain.      Marland Kitchen omeprazole (PRILOSEC) 20 MG capsule Take 20 mg by mouth daily.        . predniSONE (DELTASONE) 20 MG tablet Take 20 mg by mouth daily as needed. Takes if having breathing issues.      Marland Kitchen tiotropium (SPIRIVA) 18 MCG inhalation capsule Place 18 mcg into  inhaler and inhale daily as needed. For COPD.      Marland Kitchen verapamil (COVERA HS) 240 MG (CO) 24 hr tablet Take 240 mg by mouth at bedtime.        Marland Kitchen DISCONTD: verapamil (CALAN-SR) 120 MG CR tablet Take 360 mg daily add bedtime.  Add 1 pill of 120 mg to your pill of 240 mg.  30 tablet  0    Allergies  Allergen Reactions  . Amoxicillin Diarrhea    Past Medical History  Diagnosis Date  . Hypertension   . COPD (chronic obstructive pulmonary disease)   . Aortic insufficiency     Mild to moderate per echo in Jan 2012  . Dizziness   . PAF (paroxysmal atrial fibrillation)   . History of GI bleed   . Anemia   . DOE (dyspnea on exertion)   . Chest pain     Minimal disease per cath in 2010  . Mild aortic stenosis     Per echo in Jan 2012    Past Surgical History  Procedure Date  . Cardiac catheterization 03/10/2008    EF 65%; Minimal luminal irregularities with no obstructive  lesions  . Cholecystectomy   . Hernia repair   . Transthoracic echocardiogram 02/05/2010    EF 55-65%; Mild AS, mild to moderate AI  . Cardiovascular stress test 02/29/2008    EF 60%    History  Smoking status  . Former Smoker  . Quit date: 01/11/2008  Smokeless tobacco  . Not on file    History  Alcohol Use No    Family History  Problem Relation Age of Onset  . Heart attack Father     Review of Systems: The review of systems is positive for fatigue.  All other systems were reviewed and are negative.  Physical Exam: BP 142/64  Pulse 70  Ht 5' 4.5" (1.638 m)  Wt 188 lb (85.276 kg)  BMI 31.77 kg/m2 Patient is very pleasant and in no acute distress. Skin is warm and dry. Color is normal.  HEENT is unremarkable. Normocephalic/atraumatic. PERRL. Sclera are nonicteric. Neck is supple. No masses. No JVD. Lungs are a little coarse. Cardiac exam shows a regular rate and rhythm. Soft outflow murmur noted. Abdomen is soft. Extremities are without edema. Gait and ROM are intact. No gross neurologic deficits  noted.   LABORATORY DATA: PENDING. Her EKG today shows sinus rhythm with PAC's with ST and T wave changes.    Assessment / Plan:

## 2011-02-22 NOTE — Assessment & Plan Note (Signed)
We are going to update her echo. She does have AS.

## 2011-02-22 NOTE — Assessment & Plan Note (Signed)
Patient has presented with recent episode of atrial fib with RVR. I have changed her Verapamil to just a total of 360 mg. She will take the 180 mg BID. She is not a candidate for anticoagulation due to prior GI bleeding. May need to consider antiarrhythmic therapy if she has recurrence.

## 2011-02-22 NOTE — Assessment & Plan Note (Signed)
Blood pressure is up at home. I have increased her Lisinopril to 20 mg daily. We will recheck a BMET today as well. She will continue to monitor her readings at home. We will see her back at her regular visit later this month. Patient is agreeable to this plan and will call if any problems develop in the interim.

## 2011-02-22 NOTE — Assessment & Plan Note (Signed)
She has chest discomforts off and on. Last cath in 2010 showing mild CAD. May need to consider repeat stress testing on return visit.

## 2011-02-22 NOTE — Telephone Encounter (Signed)
Patient saw Lawson Fiscal NP today

## 2011-02-22 NOTE — Patient Instructions (Signed)
We are going to increase the Lisinipril to 20 mg daily. This should help with your blood pressure.  Take the Verapamil 180 mg two times a day only  We are going to get an ultrasound of your heart.  Check some blood pressures at home.  We are going to recheck your labs today.  We will see you back on the 20th as scheduled.   Call the [facility] office at [phone] if you have any questions, problems or concerns.

## 2011-02-24 ENCOUNTER — Telehealth: Payer: Self-pay | Admitting: Cardiology

## 2011-02-24 ENCOUNTER — Telehealth: Payer: Self-pay | Admitting: *Deleted

## 2011-02-24 NOTE — Telephone Encounter (Signed)
Advised of results

## 2011-02-24 NOTE — Telephone Encounter (Signed)
Message copied by Burnell Blanks on Thu Feb 24, 2011 10:24 AM ------      Message from: Rosalio Macadamia      Created: Tue Feb 22, 2011  3:18 PM       Ok to report. Labs are satisfactory. Potassium is fine. Will see how her echo turns out. Has follow up visit with Dr. [name] this month.

## 2011-02-24 NOTE — Telephone Encounter (Signed)
Advised of labs 

## 2011-02-24 NOTE — Telephone Encounter (Signed)
FU Call: Pt returning call to our office. Please return pt call to discuss further.  

## 2011-02-25 ENCOUNTER — Other Ambulatory Visit: Payer: Self-pay

## 2011-02-25 ENCOUNTER — Ambulatory Visit (HOSPITAL_COMMUNITY): Payer: Medicare Other | Attending: Cardiovascular Disease | Admitting: Radiology

## 2011-02-25 DIAGNOSIS — J4489 Other specified chronic obstructive pulmonary disease: Secondary | ICD-10-CM | POA: Insufficient documentation

## 2011-02-25 DIAGNOSIS — I10 Essential (primary) hypertension: Secondary | ICD-10-CM | POA: Insufficient documentation

## 2011-02-25 DIAGNOSIS — R0609 Other forms of dyspnea: Secondary | ICD-10-CM | POA: Insufficient documentation

## 2011-02-25 DIAGNOSIS — J449 Chronic obstructive pulmonary disease, unspecified: Secondary | ICD-10-CM | POA: Insufficient documentation

## 2011-02-25 DIAGNOSIS — I350 Nonrheumatic aortic (valve) stenosis: Secondary | ICD-10-CM

## 2011-02-25 DIAGNOSIS — E669 Obesity, unspecified: Secondary | ICD-10-CM | POA: Insufficient documentation

## 2011-02-25 DIAGNOSIS — R0989 Other specified symptoms and signs involving the circulatory and respiratory systems: Secondary | ICD-10-CM | POA: Insufficient documentation

## 2011-02-25 DIAGNOSIS — Z87891 Personal history of nicotine dependence: Secondary | ICD-10-CM | POA: Insufficient documentation

## 2011-02-25 DIAGNOSIS — I359 Nonrheumatic aortic valve disorder, unspecified: Secondary | ICD-10-CM | POA: Insufficient documentation

## 2011-02-28 ENCOUNTER — Telehealth: Payer: Self-pay | Admitting: *Deleted

## 2011-02-28 NOTE — Telephone Encounter (Signed)
Message copied by Burnell Blanks on Mon Feb 28, 2011  3:01 PM ------      Message from: Cassell Clement      Created: Mon Feb 28, 2011 10:53 AM       Please report.  The echocardiogram is satisfactory.  Left ventricular systolic function is normal.  The aortic valve is only mild stenosis and insufficiency.  Continue same medication.

## 2011-02-28 NOTE — Telephone Encounter (Signed)
Advised of echo results 

## 2011-03-02 ENCOUNTER — Encounter: Payer: Self-pay | Admitting: Cardiology

## 2011-03-02 ENCOUNTER — Ambulatory Visit (INDEPENDENT_AMBULATORY_CARE_PROVIDER_SITE_OTHER): Payer: Medicare Other | Admitting: Cardiology

## 2011-03-02 VITALS — BP 140/70 | HR 84 | Ht 64.0 in | Wt 186.0 lb

## 2011-03-02 DIAGNOSIS — E119 Type 2 diabetes mellitus without complications: Secondary | ICD-10-CM

## 2011-03-02 DIAGNOSIS — R0789 Other chest pain: Secondary | ICD-10-CM

## 2011-03-02 DIAGNOSIS — E7800 Pure hypercholesterolemia, unspecified: Secondary | ICD-10-CM

## 2011-03-02 DIAGNOSIS — J449 Chronic obstructive pulmonary disease, unspecified: Secondary | ICD-10-CM

## 2011-03-02 DIAGNOSIS — I119 Hypertensive heart disease without heart failure: Secondary | ICD-10-CM

## 2011-03-02 DIAGNOSIS — Z8719 Personal history of other diseases of the digestive system: Secondary | ICD-10-CM

## 2011-03-02 DIAGNOSIS — I480 Paroxysmal atrial fibrillation: Secondary | ICD-10-CM

## 2011-03-02 DIAGNOSIS — I4891 Unspecified atrial fibrillation: Secondary | ICD-10-CM

## 2011-03-02 NOTE — Progress Notes (Signed)
[name] Date of Birth:  Oct 17, 1929 [facility] [address] [place], [place]  [address] [phone]         Fax   [phone]  History of Present Illness: This pleasant 78 year old woman is seen for a scheduled four-month followup office visit.  She has a past history of paroxysmal atrial fibrillation.  She has a history of atypical chest pain and a history of elevated blood pressure.  She recently presented to the emergency room with lightheadedness and chest pain and was noted to be in atrial fibrillation at a rate of 200.  Enzymes were negative and she converted spontaneously and was sent home on additional verapamil.  Since then she's had no recurrent atrial fibrillation.  She is now on verapamil 180 mg twice a day.  She had an echocardiogram on 02/25/11 which showed normal LV systolic function and only minimal aortic valve stenosis  Current Outpatient Prescriptions  Medication Sig Dispense Refill  . albuterol (PROVENTIL HFA;VENTOLIN HFA) 108 (90 BASE) MCG/ACT inhaler Inhale 2 puffs into the lungs every 6 (six) hours as needed. For shortness of breath.      Marland Kitchen amitriptyline (ELAVIL) 50 MG tablet Take 50 mg by mouth at bedtime.        Marland Kitchen aspirin 81 MG tablet Take 81 mg by mouth daily.        Marland Kitchen atorvastatin (LIPITOR) 20 MG tablet Take 20 mg by mouth daily.        . furosemide (LASIX) 20 MG tablet Take 20 mg by mouth daily.      Marland Kitchen guaiFENesin (MUCINEX) 600 MG 12 hr tablet Take 1,200 mg by mouth as needed. For cough/congestion.      Marland Kitchen lisinopril (PRINIVIL,ZESTRIL) 10 MG tablet Take 2 tablets (20 mg total) by mouth daily.      . metFORMIN (GLUCOPHAGE) 500 MG tablet Take 500 mg by mouth 2 (two) times daily with a meal.      . nitroGLYCERIN (NITROSTAT) 0.4 MG SL tablet Place 0.4 mg under the tongue every 5 (five) minutes as needed. For chest pain.      Marland Kitchen omeprazole (PRILOSEC) 20 MG capsule Take 20 mg by mouth daily.        Marland Kitchen tiotropium (SPIRIVA) 18 MCG inhalation  capsule Place 18 mcg into inhaler and inhale daily as needed. For COPD.      Marland Kitchen verapamil (CALAN-SR) 180 MG CR tablet Take 1 tablet (180 mg total) by mouth 2 (two) times daily.      Marland Kitchen DISCONTD: verapamil (COVERA HS) 240 MG (CO) 24 hr tablet Take 240 mg by mouth at bedtime.          Allergies  Allergen Reactions  . Amoxicillin Diarrhea    Patient Active Problem List  Diagnoses  . Atypical chest pain  . HTN (hypertension)  . COPD (chronic obstructive pulmonary disease)  . Valvular heart disease  . H/O: GI bleed  . Atrial fibrillation    History  Smoking status  . Former Smoker  . Quit date: 01/11/2008  Smokeless tobacco  . Not on file    History  Alcohol Use No    Family History  Problem Relation Age of Onset  . Heart attack Father     Review of Systems: Constitutional: no fever chills diaphoresis or fatigue or change in weight.  Head and neck: no hearing loss, no epistaxis, no photophobia or visual disturbance. Respiratory: No cough, shortness of breath or wheezing. Cardiovascular: No chest pain peripheral edema,  palpitations. Gastrointestinal: No abdominal distention, no abdominal pain, no change in bowel habits hematochezia or melena. Genitourinary: No dysuria, no frequency, no urgency, no nocturia. Musculoskeletal:No arthralgias, no back pain, no gait disturbance or myalgias. Neurological: No dizziness, no headaches, no numbness, no seizures, no syncope, no weakness, no tremors. Hematologic: No lymphadenopathy, no easy bruising. Psychiatric: No confusion, no hallucinations, no sleep disturbance.    Physical Exam: Filed Vitals:   03/02/11 1439  BP: 140/70  Pulse: 84   the general appearance reveals a well-developed well-nourished woman in no distress.Pupils equal and reactive.   Extraocular Movements are full.  There is no scleral icterus.  The mouth and pharynx are normal.  The neck is supple.  The carotids reveal no bruits.  The jugular venous pressure is  normal.  The thyroid is not enlarged.  There is no lymphadenopathy.  The chest reveals mild expiratory wheezing the heart reveals a soft systolic ejection murmur at the base.  No diastolic murmur.  No gallop.  The rhythm is regular.The abdomen is soft and nontender. Bowel sounds are normal. The liver and spleen are not enlarged. There Are no abdominal masses. There are no bruits.  The pedal pulses are good.  There is no phlebitis or edema.  There is no cyanosis or clubbing. Strength is normal and symmetrical in all extremities.  There is no lateralizing weakness.  There are no sensory deficits.  The skin is warm and dry.  There is no rash.    Assessment / Plan: Continue on same medication.  Recheck in 4 months for office visit and EKG

## 2011-03-02 NOTE — Assessment & Plan Note (Signed)
She denies any recent chest pain since her emergency room visit several weeks ago.

## 2011-03-02 NOTE — Patient Instructions (Signed)
Your physician recommends that you continue on your current medications as directed. Please refer to the Current Medication list given to you today. Your physician recommends that you schedule a follow-up appointment in: 4 months ov/EKG

## 2011-03-02 NOTE — Assessment & Plan Note (Signed)
Her symptoms of COPD are stable on her current medical therapy.

## 2011-03-02 NOTE — Assessment & Plan Note (Signed)
The patient is not a good candidate for long-term Coumadin because of the history of GI bleeds.  Fortunately she is maintaining normal sinus rhythm at the present time.

## 2011-03-09 ENCOUNTER — Telehealth: Payer: Self-pay | Admitting: Cardiology

## 2011-03-09 ENCOUNTER — Other Ambulatory Visit (HOSPITAL_COMMUNITY): Payer: Self-pay | Admitting: Cardiology

## 2011-03-09 DIAGNOSIS — I119 Hypertensive heart disease without heart failure: Secondary | ICD-10-CM

## 2011-03-09 MED ORDER — LISINOPRIL 20 MG PO TABS: 20.0000 mg | ORAL_TABLET | Freq: Two times a day (BID) | ORAL | Status: DC

## 2011-03-09 NOTE — Telephone Encounter (Signed)
Advised daughter and called to pharmacy

## 2011-03-09 NOTE — Telephone Encounter (Signed)
Refilled verapamil.

## 2011-03-09 NOTE — Telephone Encounter (Signed)
If she is tolerating it I would keep taking the lisinopril 20 mg twice a day.

## 2011-03-09 NOTE — Telephone Encounter (Signed)
When patient saw Lawson Fiscal NP she thought her mg on the Lisinopril was 10 mg daily but was actually 20 mg.  Lawson Fiscal NP advised her to increase to 10 mg 2 daily and she increase to 20 mg 2 daily.  Will forward to  Dr. [name] for reivew

## 2011-03-09 NOTE — Telephone Encounter (Signed)
New Problem   Please return call to patient at hm#[phone] regarding medication dosage

## 2011-03-21 ENCOUNTER — Telehealth: Payer: Self-pay | Admitting: Cardiology

## 2011-03-21 DIAGNOSIS — I119 Hypertensive heart disease without heart failure: Secondary | ICD-10-CM

## 2011-03-21 MED ORDER — SPIRONOLACTONE 25 MG PO TABS: 25.0000 mg | ORAL_TABLET | Freq: Every day | ORAL | Status: DC

## 2011-03-21 NOTE — Telephone Encounter (Signed)
blood pressure still running 160-180's.  Patient takes Lisinopril 20 mg 2 in the am and Verapamil 180 mg bid.  Will forward to  Dr. [name] for reveiw

## 2011-03-21 NOTE — Telephone Encounter (Signed)
Add spironolactone 25 mg daily. See Lawson Fiscal or me in 1 week for OV and BMET.  Bring home BP cuff with her

## 2011-03-21 NOTE — Telephone Encounter (Signed)
Pt calling re BP still high, told to call after med change

## 2011-03-21 NOTE — Telephone Encounter (Signed)
Advised patient and scheduled appointment  

## 2011-03-29 ENCOUNTER — Encounter: Payer: Self-pay | Admitting: Nurse Practitioner

## 2011-03-29 ENCOUNTER — Ambulatory Visit (INDEPENDENT_AMBULATORY_CARE_PROVIDER_SITE_OTHER): Payer: Medicare Other | Admitting: Nurse Practitioner

## 2011-03-29 VITALS — BP 102/58 | HR 72 | Ht 64.5 in | Wt 189.0 lb

## 2011-03-29 DIAGNOSIS — I119 Hypertensive heart disease without heart failure: Secondary | ICD-10-CM

## 2011-03-29 DIAGNOSIS — I4891 Unspecified atrial fibrillation: Secondary | ICD-10-CM

## 2011-03-29 DIAGNOSIS — I10 Essential (primary) hypertension: Secondary | ICD-10-CM

## 2011-03-29 LAB — BASIC METABOLIC PANEL
BUN: 17 mg/dL (ref 6–23)
Chloride: 103 mEq/L (ref 96–112)
Creatinine, Ser: 0.8 mg/dL (ref 0.4–1.2)
GFR: 75.55 mL/min (ref >60.00)

## 2011-03-29 NOTE — Assessment & Plan Note (Signed)
No recurrence. Overall, she is doing quite well. Will see her back in June as scheduled with Dr. [name].

## 2011-03-29 NOTE — Assessment & Plan Note (Signed)
Blood pressure has improved both here and at home. She is tolerating her current medicines. Will recheck a BMET today. Will see her back at her regular appointment in June. Family will continue to monitor her blood pressure at home. Her cuff does correlate. Patient is agreeable to this plan and will call if any problems develop in the interim.

## 2011-03-29 NOTE — Patient Instructions (Signed)
I think you are doing well  Stay on your current medicines.  We will recheck your potassium level today  Keep your June appointment with Dr. [name].  Call the [facility] office at [phone] if you have any questions, problems or concerns.

## 2011-03-29 NOTE — Progress Notes (Signed)
[name] Date of Birth: 03-Jan-1930 Medical Record #[id number]  History of Present Illness: [name] is seen back today for a follow up visit. She is seen for Dr. [name]. She has been started on Aldactone for better blood pressure control. She has a history of PAF and was in the ER recently with a recurrent spell. She converted spontaneously and was sent home on higher doses of Verapamil. She is not a candidate for coumadin due to past GI bleeding. Her echo from February of 2013 showed normal LV function with only mininal aortic valve stenosis. Her other problems include HTN, COPD, HLD and diabetes.   She comes in today. She is here with her daughter. She is doing well. Has just returned from the beach. She feels good. Blood pressure has been trending down. She will have an elevated reading occasionally, but overall it is improved. No chest pain. Breathing is at baseline. She is not lightheaded or dizzy. We need to recheck her potassium level today. She does eat more foods high in potassium.   Current Outpatient Prescriptions on File Prior to Visit  Medication Sig Dispense Refill  . albuterol (PROVENTIL HFA;VENTOLIN HFA) 108 (90 BASE) MCG/ACT inhaler Inhale 2 puffs into the lungs every 6 (six) hours as needed. For shortness of breath.      Marland Kitchen amitriptyline (ELAVIL) 50 MG tablet Take 50 mg by mouth at bedtime.        Marland Kitchen aspirin 81 MG tablet Take 81 mg by mouth daily.        Marland Kitchen atorvastatin (LIPITOR) 20 MG tablet Take 20 mg by mouth daily.        . furosemide (LASIX) 20 MG tablet Take 20 mg by mouth daily.      Marland Kitchen guaiFENesin (MUCINEX) 600 MG 12 hr tablet Take 1,200 mg by mouth as needed. For cough/congestion.      Marland Kitchen lisinopril (PRINIVIL,ZESTRIL) 20 MG tablet Take 1 tablet (20 mg total) by mouth 2 (two) times daily.  60 tablet  11  . metFORMIN (GLUCOPHAGE) 500 MG tablet Take 500 mg by mouth 2 (two) times daily with a meal.      . nitroGLYCERIN (NITROSTAT) 0.4 MG SL tablet Place 0.4 mg under  the tongue every 5 (five) minutes as needed. For chest pain.      Marland Kitchen omeprazole (PRILOSEC) 20 MG capsule Take 20 mg by mouth daily.        Marland Kitchen spironolactone (ALDACTONE) 25 MG tablet Take 1 tablet (25 mg total) by mouth daily.  30 tablet  5  . tiotropium (SPIRIVA) 18 MCG inhalation capsule Place 18 mcg into inhaler and inhale daily as needed. For COPD.      Marland Kitchen verapamil (CALAN-SR) 180 MG CR tablet Take 1 tablet (180 mg total) by mouth 2 (two) times daily.        Allergies  Allergen Reactions  . Amoxicillin Diarrhea    Past Medical History  Diagnosis Date  . Hypertension   . COPD (chronic obstructive pulmonary disease)   . Aortic insufficiency     Mild to moderate per echo in Jan 2012  . Dizziness   . PAF (paroxysmal atrial fibrillation)   . History of GI bleed   . Anemia   . DOE (dyspnea on exertion)   . Chest pain     Minimal disease per cath in 2010  . Mild aortic stenosis     Per echo in Jan 2012    Past Surgical History  Procedure Date  .  Cardiac catheterization 03/10/2008    EF 65%; Minimal luminal irregularities with no obstructive lesions  . Cholecystectomy   . Hernia repair   . Transthoracic echocardiogram 02/05/2010    EF 55-65%; Mild AS, mild to moderate AI  . Cardiovascular stress test 02/29/2008    EF 60%    History  Smoking status  . Former Smoker  . Quit date: 01/11/2008  Smokeless tobacco  . Not on file    History  Alcohol Use No    Family History  Problem Relation Age of Onset  . Heart attack Father     Review of Systems: The review of systems is per the HPI.  No recurrent arrhythmia. All other systems were reviewed and are negative.  Physical Exam: BP 102/58  Pulse 72  Ht 5' 4.5" (1.638 m)  Wt 189 lb (85.73 kg)  BMI 31.94 kg/m2 Patient is very pleasant and in no acute distress. Skin is warm and dry. Color is normal.  HEENT is unremarkable. Normocephalic/atraumatic. PERRL. Sclera are nonicteric. Neck is supple. No masses. No JVD. Lungs are  clear. Cardiac exam shows a regular rate and rhythm. Soft outflow murmur noted at the base. Abdomen is soft. Extremities are without edema. Gait and ROM are intact. No gross neurologic deficits noted.    Lab Results  Component Value Date   WBC 8.0 02/16/2011   HGB 12.4 02/16/2011   HCT 39.2 02/16/2011   PLT 287 02/16/2011   GLUCOSE 102* 02/22/2011   ALT 26 11/23/2008   AST 21 11/23/2008   NA 143 02/22/2011   K 4.2 02/22/2011   CL 104 02/22/2011   CREATININE 0.7 02/22/2011   BUN 13 02/22/2011   CO2 31 02/22/2011   TSH 2.53 02/22/2011   INR 0.93 02/16/2011   HGBA1C  Value: 5.5 (NOTE) The ADA recommends the following therapeutic goal for glycemic control related to Hgb A1c measurement: Goal of therapy: <6.5 Hgb A1c  Reference: American Diabetes Association: Clinical Practice Recommendations 2010, Diabetes Care, 2010, 33: (Suppl  1). 11/01/2008     Assessment / Plan:

## 2011-03-29 NOTE — Progress Notes (Signed)
Quick Note:  Please report to patient. The recent labs are stable. Continue same medication and careful diet. Blood sugar and K are normal now. ______

## 2011-03-30 ENCOUNTER — Telehealth: Payer: Self-pay | Admitting: *Deleted

## 2011-03-30 NOTE — Telephone Encounter (Signed)
Mailed copy of labs and left message to call if any questions  

## 2011-03-30 NOTE — Telephone Encounter (Signed)
Message copied by Burnell Blanks on Wed Mar 30, 2011  3:02 PM ------      Message from: Cassell Clement      Created: Tue Mar 29, 2011  9:09 PM       Please report to patient.  The recent labs are stable. Continue same medication and careful diet. Blood sugar and K are normal now.

## 2011-05-19 ENCOUNTER — Other Ambulatory Visit: Payer: Self-pay | Admitting: *Deleted

## 2011-05-19 MED ORDER — VERAPAMIL HCL ER 180 MG PO TBCR: 180.0000 mg | EXTENDED_RELEASE_TABLET | Freq: Two times a day (BID) | ORAL | Status: DC

## 2011-07-07 ENCOUNTER — Ambulatory Visit (INDEPENDENT_AMBULATORY_CARE_PROVIDER_SITE_OTHER): Payer: Medicare Other | Admitting: Cardiology

## 2011-07-07 ENCOUNTER — Encounter: Payer: Self-pay | Admitting: Cardiology

## 2011-07-07 VITALS — BP 118/68 | HR 88 | Ht 64.0 in | Wt 190.0 lb

## 2011-07-07 DIAGNOSIS — I480 Paroxysmal atrial fibrillation: Secondary | ICD-10-CM

## 2011-07-07 DIAGNOSIS — I10 Essential (primary) hypertension: Secondary | ICD-10-CM

## 2011-07-07 DIAGNOSIS — I4891 Unspecified atrial fibrillation: Secondary | ICD-10-CM

## 2011-07-07 DIAGNOSIS — I119 Hypertensive heart disease without heart failure: Secondary | ICD-10-CM

## 2011-07-07 DIAGNOSIS — D649 Anemia, unspecified: Secondary | ICD-10-CM

## 2011-07-07 DIAGNOSIS — Z8719 Personal history of other diseases of the digestive system: Secondary | ICD-10-CM

## 2011-07-07 LAB — BASIC METABOLIC PANEL
GFR: 58.01 mL/min — ABNORMAL LOW (ref >60.00)
Glucose, Bld: 102 mg/dL — ABNORMAL HIGH (ref 70–99)
Potassium: 4.6 mEq/L (ref 3.5–5.1)
Sodium: 142 mEq/L (ref 135–145)

## 2011-07-07 LAB — CBC WITH DIFFERENTIAL/PLATELET
Eosinophils Relative: 1.2 % (ref 0.0–5.0)
MCV: 95 fl (ref 78.0–100.0)
Monocytes Absolute: 0.7 10*3/uL (ref 0.1–1.0)
Neutrophils Relative %: 64.3 % (ref 43.0–77.0)
Platelets: 347 10*3/uL (ref 150.0–400.0)
WBC: 8.5 10*3/uL (ref 4.5–10.5)

## 2011-07-07 NOTE — Progress Notes (Signed)
Quick Note:  Please report to patient. The recent labs are stable. Continue same medication and careful diet. Hgb 10.4 about the same as at Dr. Randa Evens office 2 weeks ago. ______

## 2011-07-07 NOTE — Assessment & Plan Note (Signed)
As noted she had a history of GI bleed recurring in April 2013.  She saw her gastroenterologist Dr. Randa Evens 2 weeks ago and her hemoglobin at that time was 10.6.  He thinks that she may still be bleeding.  Her stools are dark.  She has felt weak.  We are checking a CBC today.  She has had some dizzy spells.

## 2011-07-07 NOTE — Patient Instructions (Signed)
Will obtain labs today and call you with the results  Your physician wants you to follow-up in: 4 months You will receive a reminder letter in the mail two months in advance. If you don't receive a letter, please call our office to schedule the follow-up appointment.  Your physician recommends that you continue on your current medications as directed. Please refer to the Current Medication list given to you today.

## 2011-07-07 NOTE — Assessment & Plan Note (Signed)
The patient has had no recurrence of her atrial fibrillation.  She is not on anticoagulation.

## 2011-07-07 NOTE — Progress Notes (Signed)
Doyne Keel Date of Birth:  1929/02/02 [facility] [address] [place], [place]  [address] [phone]         Fax   [phone]  History of Present Illness: This pleasant 78 year old woman is seen for a scheduled followup visit.  She has a past history of paroxysmal atrial fibrillation and a past history of essential hypertension.  She also has a previous history of GI bleeding.  She is not on Coumadin because of her GI bleeding history.  Of note is the fact that she was vacationing at the beach in April 2013 and had another  GI bleed.  She had an endoscopy at a hospital at the beach and has had subsequent followup with Dr. Randa Evens.  She is still feeling weak.  Current Outpatient Prescriptions  Medication Sig Dispense Refill  . albuterol (PROVENTIL HFA;VENTOLIN HFA) 108 (90 BASE) MCG/ACT inhaler Inhale 2 puffs into the lungs every 6 (six) hours as needed. For shortness of breath.      Marland Kitchen amitriptyline (ELAVIL) 50 MG tablet Take 50 mg by mouth at bedtime.        Marland Kitchen aspirin 81 MG tablet Take 81 mg by mouth daily.        Marland Kitchen atorvastatin (LIPITOR) 20 MG tablet Take 20 mg by mouth daily.       . furosemide (LASIX) 20 MG tablet Take 20 mg by mouth daily.      Marland Kitchen guaiFENesin (MUCINEX) 600 MG 12 hr tablet Take 1,200 mg by mouth as needed. For cough/congestion.      Marland Kitchen lisinopril (PRINIVIL,ZESTRIL) 20 MG tablet Take 1 tablet (20 mg total) by mouth 2 (two) times daily.  60 tablet  11  . loratadine (CLARITIN) 10 MG tablet Take 10 mg by mouth daily.      . metFORMIN (GLUCOPHAGE) 500 MG tablet Take 500 mg by mouth 2 (two) times daily with a meal.      . nitroGLYCERIN (NITROSTAT) 0.4 MG SL tablet Place 0.4 mg under the tongue every 5 (five) minutes as needed. For chest pain.      Marland Kitchen omeprazole (PRILOSEC) 20 MG capsule Take 20 mg by mouth daily.        Marland Kitchen spironolactone (ALDACTONE) 25 MG tablet Take 1 tablet (25 mg total) by mouth daily.  30 tablet  5  . tiotropium (SPIRIVA) 18 MCG  inhalation capsule Place 18 mcg into inhaler and inhale daily as needed. For COPD.      Marland Kitchen verapamil (CALAN-SR) 180 MG CR tablet Take 180 mg by mouth 1 day or 1 dose.      Marland Kitchen DISCONTD: verapamil (CALAN-SR) 180 MG CR tablet Take 1 tablet (180 mg total) by mouth 2 (two) times daily.  60 tablet  6    Allergies  Allergen Reactions  . Amoxicillin Diarrhea    Patient Active Problem List  Diagnosis  . Atypical chest pain  . HTN (hypertension)  . COPD (chronic obstructive pulmonary disease)  . Valvular heart disease  . H/O: GI bleed  . Atrial fibrillation    History  Smoking status  . Former Smoker  . Quit date: 01/11/2008  Smokeless tobacco  . Not on file    History  Alcohol Use No    Family History  Problem Relation Age of Onset  . Heart attack Father     Review of Systems: Constitutional: no fever chills diaphoresis or fatigue or change in weight.  Head and neck: no hearing loss, no epistaxis, no  photophobia or visual disturbance. Respiratory: No cough, shortness of breath or wheezing. Cardiovascular: No chest pain peripheral edema, palpitations. Gastrointestinal: No abdominal distention, no abdominal pain, no change in bowel habits hematochezia or melena. Genitourinary: No dysuria, no frequency, no urgency, no nocturia. Musculoskeletal:No arthralgias, no back pain, no gait disturbance or myalgias. Neurological: No dizziness, no headaches, no numbness, no seizures, no syncope, no weakness, no tremors. Hematologic: No lymphadenopathy, no easy bruising. Psychiatric: No confusion, no hallucinations, no sleep disturbance.    Physical Exam: Filed Vitals:   07/07/11 1418  BP: 118/68  Pulse: 88   the general appearance reveals a well-developed well-nourished woman in no distress.  The head and neck exam reveals pupils equal and reactive.  Extraocular movements are full.  There is no scleral icterus.  The mouth and pharynx are normal.  The neck is supple.  The carotids reveal  no bruits.  The jugular venous pressure is normal.  The  thyroid is not enlarged.  There is no lymphadenopathy.  The chest is clear to percussion and auscultation.  There are no rales or rhonchi.  Expansion of the chest is symmetrical.  The precordium is quiet.  The first heart sound is normal.  The second heart sound is physiologically split.  There is no murmur gallop rub or click.  There is no abnormal lift or heave.  The abdomen is soft and nontender.  The bowel sounds are normal.  The liver and spleen are not enlarged.  There are no abdominal masses.  There are no abdominal bruits.  Extremities reveal good pedal pulses.  There is no phlebitis or edema.  There is no cyanosis or clubbing.  Strength is normal and symmetrical in all extremities.  There is no lateralizing weakness.  There are no sensory deficits.  The skin is warm and dry.  There is no rash.     Assessment / Plan: She is to continue same medication.  We're checking blood work today including a basal metabolic panel and a CBC.  Recheck in 4 months for followup office visit.

## 2011-07-07 NOTE — Assessment & Plan Note (Signed)
Patient has a past history of high blood pressure.  She is on a multidrug regimen including mL, spironolactone, lisinopril, and furosemide.  Blood pressure has been maintaining in the low normal range on the current regimen

## 2011-07-08 ENCOUNTER — Telehealth: Payer: Self-pay | Admitting: *Deleted

## 2011-07-08 NOTE — Telephone Encounter (Signed)
Mailed copy of labs and left message to call if any questions  

## 2011-07-08 NOTE — Telephone Encounter (Signed)
Message copied by Burnell Blanks on Fri Jul 08, 2011  2:23 PM ------      Message from: Cassell Clement      Created: Thu Jul 07, 2011  9:15 PM       Please report to patient.  The recent labs are stable. Continue same medication and careful diet. Hgb 10.4 about the same as at Dr. Randa Evens office 2 weeks ago.

## 2011-08-08 ENCOUNTER — Ambulatory Visit (HOSPITAL_COMMUNITY): Payer: Medicare Other | Attending: Cardiology | Admitting: Radiology

## 2011-08-08 VITALS — BP 103/48 | Ht 64.5 in | Wt 187.0 lb

## 2011-08-08 DIAGNOSIS — J4489 Other specified chronic obstructive pulmonary disease: Secondary | ICD-10-CM | POA: Insufficient documentation

## 2011-08-08 DIAGNOSIS — J449 Chronic obstructive pulmonary disease, unspecified: Secondary | ICD-10-CM | POA: Insufficient documentation

## 2011-08-08 DIAGNOSIS — R0602 Shortness of breath: Secondary | ICD-10-CM

## 2011-08-08 DIAGNOSIS — R079 Chest pain, unspecified: Secondary | ICD-10-CM

## 2011-08-08 MED ORDER — ALBUTEROL SULFATE (5 MG/ML) 0.5% IN NEBU
5.0000 mg | INHALATION_SOLUTION | Freq: Once | RESPIRATORY_TRACT | Status: AC
  Administered 2011-08-08: 5 mg via RESPIRATORY_TRACT

## 2011-08-08 MED ORDER — TECHNETIUM TC 99M TETROFOSMIN IV KIT
30.0000 | PACK | Freq: Once | INTRAVENOUS | Status: AC | PRN
  Administered 2011-08-08: 30 via INTRAVENOUS

## 2011-08-08 MED ORDER — REGADENOSON 0.4 MG/5ML IV SOLN
0.4000 mg | Freq: Once | INTRAVENOUS | Status: AC
  Administered 2011-08-08: 0.4 mg via INTRAVENOUS

## 2011-08-08 MED ORDER — TECHNETIUM TC 99M TETROFOSMIN IV KIT
10.0000 | PACK | Freq: Once | INTRAVENOUS | Status: AC | PRN
  Administered 2011-08-08: 10 via INTRAVENOUS

## 2011-08-08 NOTE — Progress Notes (Signed)
[facility] SITE 3 NUCLEAR MED [address] [place] [address] [phone]  Cardiology Nuclear Med Study  [name] is a 78 y.o. female     MRN : [id number]     DOB: August 16, 1929  Procedure Date: 08/08/2011  Nuclear Med Background Indication for Stress Test:  Evaluation for Ischemia History:  '10 Myocardial Perfusion Study-Inferior wall defect although diaphragmatic attenuation cannot be excluded, EF=60%>Cath: Minor Coronary Artery irregularities, EF=65%, inferior defect most likely artifact due to diaphragmatic attenuation, 02-25-11 Echo: EF=55%, mild AS Cardiac Risk Factors: Family History - CAD, History of Smoking, Hypertension, Lipids and NIDDM  Symptoms:Chest Pain and Chest Pressure with/without exertion (last occurrence yesterday),   Dizziness, DOE, Fatigue, Fatigue with Exertion, Light-Headedness, Nausea, Near Syncope, Rapid HR and SOB   Nuclear Pre-Procedure Caffeine/Decaff Intake:  None NPO After: 7:00pm   Lungs:  Inspiratory and expiratory wheezing O2 Sat: 94% on room air. IV 0.9% NS with Angio Cath:  22g  IV Site: R Hand  IV Started by:  Sheri Parsons, RN  Chest Size (in):  42 Cup Size: DD  Height: 5' 4.5" (1.638 m)  Weight:  187 lb (84.823 kg)  BMI:  Body mass index is 31.60 kg/(m^2). Tech Comments:  Medications taken this am,CBG104 @ 0845.Nebulizer treatment with Albuterol 5mg  solution given via mask with 8L Oxygen prior to lexiscan.Sheri Hong, RN.    Nuclear Med Study 1 or 2 day study: 1 day  Stress Test Type:  Eugenie Birks  Reading MD: Sheri Millers, MD  Order Authorizing Provider:  Chilton Greathouse MD, and Sheri Clement, MD  Resting Radionuclide: Technetium 92m Tetrofosmin  Resting Radionuclide Dose: 11.0 mCi   Stress Radionuclide:  Technetium 43m Tetrofosmin  Stress Radionuclide Dose: 33.0 mCi           Stress Protocol Rest HR: 71 Stress HR: 80  Rest BP: 103/48 Stress BP: 104/47  Exercise Time (min): n/a METS: n/a   Predicted  Max HR: 140 bpm % Max HR: 57.14 bpm Rate Pressure Product: 8320   Dose of Adenosine (mg):  n/a Dose of Lexiscan: 0.4 mg  Dose of Atropine (mg): n/a Dose of Dobutamine: n/a mcg/kg/min (at max HR)  Stress Test Technologist: Sheri Hong, RN  Nuclear Technologist:  [name], CNMT     Rest Procedure:  Myocardial perfusion imaging was performed at rest 45 minutes following the intravenous administration of Technetium 35m Tetrofosmin. Rest ECG: NSR with poor R wave progression, Nonspecific ST changes inferolateral  Stress Procedure:  The patient received IV Lexiscan 0.4 mg over 15-seconds.  Technetium 42m Tetrofosmin injected at 30-seconds.  There were no significant changes with Lexiscan.  Quantitative spect images were obtained after a 45 minute delay. Stress ECG: No significant ST segment change suggestive of ischemia.  QPS Raw Data Images:  Acquisition technically good; normal left ventricular size. Stress Images:  There is decreased uptake in the anterior wall. Rest Images:  There is decreased uptake in the anterior wall. Subtraction (SDS):  No evidence of ischemia. Transient Ischemic Dilatation (Normal <1.22):  0.98 Lung/Heart Ratio (Normal <0.45):  0.25  Quantitative Gated Spect Images QGS EDV:  72 ml QGS ESV:  23 ml  Impression Exercise Capacity:  Lexiscan with no exercise. BP Response:  Normal blood pressure response. Clinical Symptoms:  No chest pain or dyspnea. ECG Impression:  No significant ST segment change suggestive of ischemia. Comparison with Prior Nuclear Study: Inferior defect and TID absent compared to previous study.  Overall Impression:  Probably normal stress  nuclear study with a small, mild, fixed anterior defect suggestive of soft tissue attenuation; no ischemia.  LV Ejection Fraction: 68%.  LV Wall Motion:  NL LV Function; NL Wall Motion  [name]

## 2011-08-09 ENCOUNTER — Encounter (HOSPITAL_COMMUNITY): Payer: Self-pay | Admitting: Internal Medicine

## 2011-08-12 ENCOUNTER — Telehealth: Payer: Self-pay | Admitting: *Deleted

## 2011-08-12 NOTE — Telephone Encounter (Signed)
Message copied by Burnell Blanks on Fri Aug 12, 2011  5:29 PM ------      Message from: Cassell Clement      Created: Thu Aug 11, 2011  2:28 PM       Please report.  Stress test was okay.  No ischemia.  Good LV systolic function.

## 2011-08-12 NOTE — Telephone Encounter (Signed)
Advised daughter of results and faxed to Dr Felipa Eth

## 2011-11-21 ENCOUNTER — Encounter: Payer: Self-pay | Admitting: Cardiology

## 2011-11-21 ENCOUNTER — Ambulatory Visit (INDEPENDENT_AMBULATORY_CARE_PROVIDER_SITE_OTHER): Payer: Medicare Other | Admitting: Cardiology

## 2011-11-21 VITALS — BP 149/57 | HR 79 | Resp 18 | Ht 64.0 in | Wt 194.0 lb

## 2011-11-21 DIAGNOSIS — R0789 Other chest pain: Secondary | ICD-10-CM

## 2011-11-21 DIAGNOSIS — I480 Paroxysmal atrial fibrillation: Secondary | ICD-10-CM

## 2011-11-21 DIAGNOSIS — E7800 Pure hypercholesterolemia, unspecified: Secondary | ICD-10-CM

## 2011-11-21 DIAGNOSIS — I4891 Unspecified atrial fibrillation: Secondary | ICD-10-CM

## 2011-11-21 DIAGNOSIS — J449 Chronic obstructive pulmonary disease, unspecified: Secondary | ICD-10-CM

## 2011-11-21 MED ORDER — VERAPAMIL HCL ER 240 MG PO TBCR: 240.0000 mg | EXTENDED_RELEASE_TABLET | Freq: Every day | ORAL | Status: AC

## 2011-11-21 NOTE — Assessment & Plan Note (Signed)
The patient has not been experiencing any recent chest pain 

## 2011-11-21 NOTE — Assessment & Plan Note (Signed)
The patient is a former smoker.  She still has some mild expiratory wheezing

## 2011-11-21 NOTE — Progress Notes (Signed)
[name] Date of Birth:  Mar 31, 1929 [facility] [address] [place], [place]  [address] [phone]         Fax   [phone]  History of Present Illness: This pleasant 78 year old woman is seen for a scheduled followup visit. She has a past history of paroxysmal atrial fibrillation and a past history of essential hypertension. She also has a previous history of GI bleeding. She is not on Coumadin because of her GI bleeding history. Of note is the fact that she was vacationing at the beach in April 2013 and had another GI bleed. She had an endoscopy at a hospital at the beach and has had subsequent followup with Dr. Randa Evens.  The patient has had no further recognized GI bleeding since then.  Her overall energy level has improved.  He has increased her activity level.  She walks her dogs several times a day.   Current Outpatient Prescriptions  Medication Sig Dispense Refill  . albuterol (PROVENTIL HFA;VENTOLIN HFA) 108 (90 BASE) MCG/ACT inhaler Inhale 2 puffs into the lungs every 6 (six) hours as needed. For shortness of breath.      Marland Kitchen amitriptyline (ELAVIL) 50 MG tablet Take 50 mg by mouth at bedtime.        Marland Kitchen aspirin 81 MG tablet Take 81 mg by mouth daily.        Marland Kitchen atorvastatin (LIPITOR) 20 MG tablet Take 20 mg by mouth daily.       . furosemide (LASIX) 20 MG tablet Take 20 mg by mouth daily.      Marland Kitchen guaiFENesin (MUCINEX) 600 MG 12 hr tablet Take 1,200 mg by mouth as needed. For cough/congestion.      Marland Kitchen lisinopril (PRINIVIL,ZESTRIL) 20 MG tablet Take 1 tablet (20 mg total) by mouth 2 (two) times daily.  60 tablet  11  . loratadine (CLARITIN) 10 MG tablet Take 10 mg by mouth daily.      . metFORMIN (GLUCOPHAGE) 500 MG tablet Take 500 mg by mouth 2 (two) times daily with a meal.      . nitroGLYCERIN (NITROSTAT) 0.4 MG SL tablet Place 0.4 mg under the tongue every 5 (five) minutes as needed. For chest pain.      Marland Kitchen omeprazole (PRILOSEC) 20 MG capsule Take 20 mg by  mouth daily.        Marland Kitchen tiotropium (SPIRIVA) 18 MCG inhalation capsule Place 18 mcg into inhaler and inhale daily as needed. For COPD.      Marland Kitchen verapamil (CALAN-SR) 240 MG CR tablet Take 1 tablet (240 mg total) by mouth daily.  30 tablet  5  . [DISCONTINUED] verapamil (CALAN-SR) 180 MG CR tablet Take 180 mg by mouth 1 day or 1 dose.        Allergies  Allergen Reactions  . Amoxicillin Diarrhea    Patient Active Problem List  Diagnosis  . Atypical chest pain  . HTN (hypertension)  . COPD (chronic obstructive pulmonary disease)  . Valvular heart disease  . H/O: GI bleed  . Atrial fibrillation    History  Smoking status  . Former Smoker  . Quit date: 01/11/2008  Smokeless tobacco  . Not on file    History  Alcohol Use No    Family History  Problem Relation Age of Onset  . Heart attack Father     Review of Systems: Constitutional: no fever chills diaphoresis or fatigue or change in weight.  Head and neck: no hearing loss, no epistaxis, no  photophobia or visual disturbance. Respiratory: No cough, shortness of breath or wheezing. Cardiovascular: No chest pain peripheral edema, palpitations. Gastrointestinal: No abdominal distention, no abdominal pain, no change in bowel habits hematochezia or melena. Genitourinary: No dysuria, no frequency, no urgency, no nocturia. Musculoskeletal:No arthralgias, no back pain, no gait disturbance or myalgias. Neurological: No dizziness, no headaches, no numbness, no seizures, no syncope, no weakness, no tremors. Hematologic: No lymphadenopathy, no easy bruising. Psychiatric: No confusion, no hallucinations, no sleep disturbance.    Physical Exam: Filed Vitals:   11/21/11 1431  BP: 149/57  Pulse: 79  Resp: 18   the general appearance reveals a well-developed well-nourished elderly woman in no distress.The head and neck exam reveals pupils equal and reactive.  Extraocular movements are full.  There is no scleral icterus.  The mouth and  pharynx are normal.  The neck is supple.  The carotids reveal no bruits.  The jugular venous pressure is normal.  The  thyroid is not enlarged.  There is no lymphadenopathy.  The chest reveals mild expiratory wheezing There are no rales or rhonchi.  Expansion of the chest is symmetrical.  The precordium is quiet.  The first heart sound is normal.  The second heart sound is physiologically split.  There is no murmur gallop rub or click.  There is no abnormal lift or heave.  The abdomen is soft and nontender.  The bowel sounds are normal.  The liver and spleen are not enlarged.  There are no abdominal masses.  There are no abdominal bruits.  Extremities reveal good pedal pulses.  There is no phlebitis or edema.  There is no cyanosis or clubbing.  Strength is normal and symmetrical in all extremities.  There is no lateralizing weakness.  There are no sensory deficits.  The skin is warm and dry.  There is no rash.     Assessment / Plan: Patient is to continue same medication except that we are increasing her verapamil SR to a dose of 240 mg daily.  This is because her systolic blood pressure continues to run slightly high. Recheck in 6 months for followup office visit and EKG.

## 2011-11-21 NOTE — Assessment & Plan Note (Signed)
The patient has not had any recurrent episodes of atrial fibrillation. 

## 2011-11-21 NOTE — Patient Instructions (Signed)
INCREASE YOUR VERAPAMIL TO 240 MG DAILY, RX SENT TO [facility]  Your physician wants you to follow-up in: 6 months You will receive a reminder letter in the mail two months in advance. If you don't receive a letter, please call our office to schedule the follow-up appointment.

## 2012-03-09 ENCOUNTER — Other Ambulatory Visit: Payer: Self-pay | Admitting: *Deleted

## 2012-03-09 DIAGNOSIS — I119 Hypertensive heart disease without heart failure: Secondary | ICD-10-CM

## 2012-03-09 MED ORDER — LISINOPRIL 20 MG PO TABS: 20.0000 mg | ORAL_TABLET | Freq: Two times a day (BID) | ORAL | Status: DC

## 2012-03-14 ENCOUNTER — Other Ambulatory Visit: Payer: Self-pay | Admitting: *Deleted

## 2012-03-14 DIAGNOSIS — I119 Hypertensive heart disease without heart failure: Secondary | ICD-10-CM

## 2012-03-14 MED ORDER — LISINOPRIL 20 MG PO TABS: 20.0000 mg | ORAL_TABLET | Freq: Two times a day (BID) | ORAL | Status: DC

## 2012-05-23 ENCOUNTER — Encounter: Payer: Self-pay | Admitting: Cardiology

## 2012-05-23 ENCOUNTER — Ambulatory Visit (INDEPENDENT_AMBULATORY_CARE_PROVIDER_SITE_OTHER): Payer: Medicare Other | Admitting: Cardiology

## 2012-05-23 VITALS — BP 130/52 | HR 84 | Ht 64.5 in | Wt 189.8 lb

## 2012-05-23 DIAGNOSIS — J449 Chronic obstructive pulmonary disease, unspecified: Secondary | ICD-10-CM

## 2012-05-23 DIAGNOSIS — R0789 Other chest pain: Secondary | ICD-10-CM

## 2012-05-23 DIAGNOSIS — Z8719 Personal history of other diseases of the digestive system: Secondary | ICD-10-CM

## 2012-05-23 DIAGNOSIS — I4891 Unspecified atrial fibrillation: Secondary | ICD-10-CM

## 2012-05-23 DIAGNOSIS — I480 Paroxysmal atrial fibrillation: Secondary | ICD-10-CM

## 2012-05-23 NOTE — Assessment & Plan Note (Addendum)
The patient has a history of COPD.  She does have home oxygen which she uses at night.  She is a nonsmoker.  However she lives with smokers in the house although they do not smoke in the house but rather they a go outside to smoke.

## 2012-05-23 NOTE — Assessment & Plan Note (Signed)
On her last trip to the beach the patient did have an episode of atypical chest pain but did not have any nitroglycerin with her at the time.  Since then she has made sure that she her with aspirin nitroglycerin with her.

## 2012-05-23 NOTE — Assessment & Plan Note (Signed)
The patient has not been we are of any recent atrial fibrillation.

## 2012-05-23 NOTE — Progress Notes (Signed)
[name] Date of Birth:  07/30/1929 [name] [address] [place], [place]  [address] [phone]         Fax   [phone]  History of Present Illness: This pleasant 79 year old woman is seen for a scheduled followup visit. She has a past history of paroxysmal atrial fibrillation and a past history of essential hypertension. She also has a previous history of GI bleeding. She is not on Coumadin because of her GI bleeding history. Of note is the fact that she was [place]ing [place] the beach in [place] 2013 and had another GI bleed. She [place] and has had subsequent followup with Dr. Randa Evens. The patient has had no further recognized GI bleeding since then. Her overall energy level has improved. He has increased her activity level. She walks her dogs several times a day.   Current Outpatient Prescriptions  Medication Sig Dispense Refill  . albuterol (PROVENTIL HFA;VENTOLIN HFA) 108 (90 BASE) MCG/ACT inhaler Inhale 2 puffs into the lungs every 6 (six) hours as needed. For shortness of breath.      Marland Kitchen amitriptyline (ELAVIL) 50 MG tablet Take 50 mg by mouth at bedtime.        Marland Kitchen aspirin 81 MG tablet Take 81 mg by mouth daily.        Marland Kitchen atorvastatin (LIPITOR) 20 MG tablet Take 20 mg by mouth daily.       . furosemide (LASIX) 20 MG tablet Take 20 mg by mouth daily.      Marland Kitchen guaiFENesin (MUCINEX) 600 MG 12 hr tablet Take 1,200 mg by mouth as needed. For cough/congestion.      Marland Kitchen lisinopril (PRINIVIL,ZESTRIL) 20 MG tablet Take 1 tablet (20 mg total) by mouth 2 (two) times daily.  60 tablet  3  . loratadine (CLARITIN) 10 MG tablet Take 10 mg by mouth daily.      . metFORMIN (GLUCOPHAGE) 500 MG tablet Take 500 mg by mouth 2 (two) times daily with a meal.      . nitroGLYCERIN (NITROSTAT) 0.4 MG SL tablet Place 0.4 mg under the tongue every 5 (five) minutes as needed. For chest pain.      Marland Kitchen omeprazole (PRILOSEC) 20 MG capsule Take 20 mg by mouth  daily.        Marland Kitchen tiotropium (SPIRIVA) 18 MCG inhalation capsule Place 18 mcg into inhaler and inhale daily as needed. For COPD.      Marland Kitchen verapamil (CALAN-SR) 240 MG CR tablet Take 1 tablet (240 mg total) by mouth daily.  30 tablet  5   No current facility-administered medications for this visit.    Allergies  Allergen Reactions  . Amoxicillin Diarrhea    Patient Active Problem List   Diagnosis Date Noted  . Atrial fibrillation 02/22/2011  . Atypical chest pain 09/03/2010  . HTN (hypertension) 09/03/2010  . COPD (chronic obstructive pulmonary disease) 09/03/2010  . Valvular heart disease 09/03/2010  . H/O: GI bleed 09/03/2010    History  Smoking status  . Former Smoker  . Quit date: 01/11/2008  Smokeless tobacco  . Not on file    History  Alcohol Use No    Family History  Problem Relation Age of Onset  . Heart attack Father     Review of Systems: Constitutional: no fever chills diaphoresis or fatigue or change in weight.  Head and neck: no hearing loss, no epistaxis, no photophobia or visual disturbance. Respiratory: No cough, shortness of  breath or wheezing. Cardiovascular: No chest pain peripheral edema, palpitations. Gastrointestinal: No abdominal distention, no abdominal pain, no change in bowel habits hematochezia or melena. Genitourinary: No dysuria, no frequency, no urgency, no nocturia. Musculoskeletal:No arthralgias, no back pain, no gait disturbance or myalgias. Neurological: No dizziness, no headaches, no numbness, no seizures, no syncope, no weakness, no tremors. Hematologic: No lymphadenopathy, no easy bruising. Psychiatric: No confusion, no hallucinations, no sleep disturbance.    Physical Exam: Filed Vitals:   05/23/12 1540  BP: 130/52  Pulse: 84   the general appearance reveals a well-developed well-nourished woman in no distress.The head and neck exam reveals pupils equal and reactive.  Extraocular movements are full.  There is no scleral  icterus.  The mouth and pharynx are normal.  The neck is supple.  The carotids reveal no bruits.  The jugular venous pressure is normal.  The  thyroid is not enlarged.  There is no lymphadenopathy.  The chest is clear to percussion and auscultation.  There are no rales or rhonchi.  Expansion of the chest is symmetrical.  The precordium is quiet.  The first heart sound is normal.  The second heart sound is physiologically split.  There is no murmur gallop rub or click.  There is no abnormal lift or heave.  The abdomen is soft and nontender.  The bowel sounds are normal.  The liver and spleen are not enlarged.  There are no abdominal masses.  There are no abdominal bruits.  Extremities reveal good pedal pulses.  There is no phlebitis or edema.  There is no cyanosis or clubbing.  Strength is normal and symmetrical in all extremities.  There is no lateralizing weakness.  There are no sensory deficits.  The skin is warm and dry.  There is no rash.  EKG shows normal sinus rhythm and no ischemic changes.   Assessment / Plan: Continue same medication.  Continue to use sublingual nitroglycerin when necessary for atypical chest pain.  Recheck in 6 months for followup office visit.

## 2012-05-23 NOTE — Assessment & Plan Note (Signed)
The patient is not on long-term anticoagulation because of her history of recurrent lower GI bleeding.  This apparently is coming from diverticulosis of the sigmoid colon according to her description.  She has not had any recent GI bleeding.

## 2012-05-23 NOTE — Patient Instructions (Addendum)
Your physician recommends that you continue on your current medications as directed. Please refer to the Current Medication list given to you today.  Your physician wants you to follow-up in: November  You will receive a reminder letter in the mail two months in advance. If you don't receive a letter, please call our office to schedule the follow-up appointment.   

## 2012-08-28 ENCOUNTER — Observation Stay (HOSPITAL_COMMUNITY)
Admission: EM | Admit: 2012-08-28 | Discharge: 2012-09-01 | Disposition: A | Discharge status: Home / Self Care | Payer: Medicare Other | Attending: Internal Medicine | Admitting: Internal Medicine

## 2012-08-28 ENCOUNTER — Emergency Department (HOSPITAL_COMMUNITY): Payer: Medicare Other

## 2012-08-28 ENCOUNTER — Encounter (HOSPITAL_COMMUNITY): Payer: Self-pay | Admitting: *Deleted

## 2012-08-28 DIAGNOSIS — I10 Essential (primary) hypertension: Secondary | ICD-10-CM | POA: Insufficient documentation

## 2012-08-28 DIAGNOSIS — J449 Chronic obstructive pulmonary disease, unspecified: Secondary | ICD-10-CM | POA: Diagnosis present

## 2012-08-28 DIAGNOSIS — R0789 Other chest pain: Secondary | ICD-10-CM | POA: Insufficient documentation

## 2012-08-28 DIAGNOSIS — R7402 Elevation of levels of lactic acid dehydrogenase (LDH): Secondary | ICD-10-CM | POA: Insufficient documentation

## 2012-08-28 DIAGNOSIS — J189 Pneumonia, unspecified organism: Secondary | ICD-10-CM | POA: Insufficient documentation

## 2012-08-28 DIAGNOSIS — I4891 Unspecified atrial fibrillation: Secondary | ICD-10-CM | POA: Diagnosis present

## 2012-08-28 DIAGNOSIS — D649 Anemia, unspecified: Secondary | ICD-10-CM

## 2012-08-28 DIAGNOSIS — R079 Chest pain, unspecified: Secondary | ICD-10-CM

## 2012-08-28 DIAGNOSIS — R7401 Elevation of levels of liver transaminase levels: Secondary | ICD-10-CM | POA: Diagnosis present

## 2012-08-28 DIAGNOSIS — J209 Acute bronchitis, unspecified: Secondary | ICD-10-CM | POA: Diagnosis present

## 2012-08-28 DIAGNOSIS — Z79899 Other long term (current) drug therapy: Secondary | ICD-10-CM | POA: Insufficient documentation

## 2012-08-28 DIAGNOSIS — K8050 Calculus of bile duct without cholangitis or cholecystitis without obstruction: Principal | ICD-10-CM | POA: Insufficient documentation

## 2012-08-28 DIAGNOSIS — E119 Type 2 diabetes mellitus without complications: Secondary | ICD-10-CM | POA: Insufficient documentation

## 2012-08-28 DIAGNOSIS — J4489 Other specified chronic obstructive pulmonary disease: Secondary | ICD-10-CM | POA: Insufficient documentation

## 2012-08-28 DIAGNOSIS — R918 Other nonspecific abnormal finding of lung field: Secondary | ICD-10-CM | POA: Insufficient documentation

## 2012-08-28 DIAGNOSIS — K219 Gastro-esophageal reflux disease without esophagitis: Secondary | ICD-10-CM | POA: Diagnosis present

## 2012-08-28 HISTORY — DX: Unspecified asthma, uncomplicated: J45.909

## 2012-08-28 LAB — URINALYSIS, ROUTINE W REFLEX MICROSCOPIC
Glucose, UA: NEGATIVE mg/dL
Nitrite: NEGATIVE
Specific Gravity, Urine: 1.016 (ref 1.005–1.030)
pH: 6.5 (ref 5.0–8.0)

## 2012-08-28 LAB — POCT I-STAT, CHEM 8
BUN: 16 mg/dL (ref 6–23)
Calcium, Ion: 1.1 mmol/L — ABNORMAL LOW (ref 1.13–1.30)
Creatinine, Ser: 1 mg/dL (ref 0.50–1.10)
Glucose, Bld: 132 mg/dL — ABNORMAL HIGH (ref 70–99)
TCO2: 30 mmol/L (ref 0–100)

## 2012-08-28 LAB — GLUCOSE, CAPILLARY
Glucose-Capillary: 112 mg/dL — ABNORMAL HIGH (ref 70–99)
Glucose-Capillary: 112 mg/dL — ABNORMAL HIGH (ref 70–99)

## 2012-08-28 LAB — URINE MICROSCOPIC-ADD ON

## 2012-08-28 LAB — HEPATIC FUNCTION PANEL
ALT: 119 U/L — ABNORMAL HIGH (ref 0–35)
AST: 244 U/L — ABNORMAL HIGH (ref 0–37)
Albumin: 3.1 g/dL — ABNORMAL LOW (ref 3.5–5.2)
Bilirubin, Direct: 0.3 mg/dL (ref 0.0–0.3)

## 2012-08-28 LAB — CBC WITH DIFFERENTIAL/PLATELET
Basophils Absolute: 0 10*3/uL (ref 0.0–0.1)
HCT: 32.3 % — ABNORMAL LOW (ref 36.0–46.0)
Lymphocytes Relative: 13 % (ref 12–46)
Lymphs Abs: 1.4 10*3/uL (ref 0.7–4.0)
Monocytes Absolute: 0.7 10*3/uL (ref 0.1–1.0)
Neutro Abs: 8.3 10*3/uL — ABNORMAL HIGH (ref 1.7–7.7)
Platelets: 275 10*3/uL (ref 150–400)
RBC: 3.42 MIL/uL — ABNORMAL LOW (ref 3.87–5.11)
RDW: 13.4 % (ref 11.5–15.5)
WBC: 10.4 10*3/uL (ref 4.0–10.5)

## 2012-08-28 LAB — POCT I-STAT TROPONIN I

## 2012-08-28 MED ORDER — NITROGLYCERIN 0.4 MG SL SUBL: 0.4000 mg | SUBLINGUAL_TABLET | SUBLINGUAL | Status: DC | PRN

## 2012-08-28 MED ORDER — ALUM & MAG HYDROXIDE-SIMETH 200-200-20 MG/5ML PO SUSP: 30.0000 mL | Freq: Four times a day (QID) | ORAL | Status: DC | PRN

## 2012-08-28 MED ORDER — MORPHINE SULFATE 2 MG/ML IJ SOLN
2.0000 mg | INTRAMUSCULAR | Status: DC | PRN
  Administered 2012-08-28 (×2): 2 mg via INTRAVENOUS
  Filled 2012-08-28 (×2): qty 1

## 2012-08-28 MED ORDER — INSULIN ASPART 100 UNIT/ML ~~LOC~~ SOLN: 0.0000 [IU] | Freq: Three times a day (TID) | SUBCUTANEOUS | Status: DC

## 2012-08-28 MED ORDER — ONDANSETRON HCL 4 MG/2ML IJ SOLN
4.0000 mg | Freq: Once | INTRAMUSCULAR | Status: AC
  Administered 2012-08-28: 4 mg via INTRAVENOUS
  Filled 2012-08-28: qty 2

## 2012-08-28 MED ORDER — ACETAMINOPHEN 325 MG PO TABS: 650.0000 mg | ORAL_TABLET | Freq: Four times a day (QID) | ORAL | Status: DC | PRN

## 2012-08-28 MED ORDER — ONDANSETRON HCL 4 MG/2ML IJ SOLN
4.0000 mg | Freq: Four times a day (QID) | INTRAMUSCULAR | Status: DC | PRN
  Administered 2012-08-28: 4 mg via INTRAVENOUS
  Filled 2012-08-28: qty 2

## 2012-08-28 MED ORDER — INSULIN ASPART 100 UNIT/ML ~~LOC~~ SOLN
0.0000 [IU] | Freq: Three times a day (TID) | SUBCUTANEOUS | Status: DC
  Administered 2012-08-30 – 2012-08-31 (×2): 2 [IU] via SUBCUTANEOUS

## 2012-08-28 MED ORDER — PANTOPRAZOLE SODIUM 40 MG PO TBEC
40.0000 mg | DELAYED_RELEASE_TABLET | Freq: Every day | ORAL | Status: DC
  Administered 2012-08-29 – 2012-09-01 (×4): 40 mg via ORAL
  Filled 2012-08-28 (×4): qty 1

## 2012-08-28 MED ORDER — FUROSEMIDE 20 MG PO TABS
20.0000 mg | ORAL_TABLET | Freq: Every day | ORAL | Status: DC
  Administered 2012-08-29 – 2012-09-01 (×3): 20 mg via ORAL
  Filled 2012-08-28 (×4): qty 1

## 2012-08-28 MED ORDER — LEVOFLOXACIN IN D5W 500 MG/100ML IV SOLN
500.0000 mg | INTRAVENOUS | Status: DC
  Filled 2012-08-28: qty 100

## 2012-08-28 MED ORDER — LISINOPRIL 20 MG PO TABS
20.0000 mg | ORAL_TABLET | Freq: Two times a day (BID) | ORAL | Status: DC
  Administered 2012-08-29 – 2012-09-01 (×7): 20 mg via ORAL
  Filled 2012-08-28 (×9): qty 1

## 2012-08-28 MED ORDER — SODIUM CHLORIDE 0.9 % IJ SOLN
3.0000 mL | Freq: Two times a day (BID) | INTRAMUSCULAR | Status: DC
  Administered 2012-08-29 – 2012-08-31 (×5): 3 mL via INTRAVENOUS

## 2012-08-28 MED ORDER — ENOXAPARIN SODIUM 40 MG/0.4ML ~~LOC~~ SOLN
40.0000 mg | SUBCUTANEOUS | Status: DC
  Administered 2012-08-28 – 2012-08-31 (×4): 40 mg via SUBCUTANEOUS
  Filled 2012-08-28 (×5): qty 0.4

## 2012-08-28 MED ORDER — AMITRIPTYLINE HCL 50 MG PO TABS
50.0000 mg | ORAL_TABLET | Freq: Every day | ORAL | Status: DC
  Administered 2012-08-29 – 2012-08-31 (×3): 50 mg via ORAL
  Filled 2012-08-28 (×5): qty 1

## 2012-08-28 MED ORDER — ONDANSETRON HCL 4 MG PO TABS
4.0000 mg | ORAL_TABLET | Freq: Four times a day (QID) | ORAL | Status: DC | PRN
  Administered 2012-08-30 – 2012-09-01 (×4): 4 mg via ORAL
  Filled 2012-08-28 (×4): qty 1

## 2012-08-28 MED ORDER — ONDANSETRON HCL 4 MG/2ML IJ SOLN: 4.0000 mg | Freq: Three times a day (TID) | INTRAMUSCULAR | Status: DC | PRN

## 2012-08-28 MED ORDER — ALBUTEROL SULFATE HFA 108 (90 BASE) MCG/ACT IN AERS
2.0000 | INHALATION_SPRAY | Freq: Four times a day (QID) | RESPIRATORY_TRACT | Status: DC | PRN
  Filled 2012-08-28: qty 6.7

## 2012-08-28 MED ORDER — ASPIRIN 81 MG PO TABS: 81.0000 mg | ORAL_TABLET | Freq: Every day | ORAL | Status: DC

## 2012-08-28 MED ORDER — GI COCKTAIL ~~LOC~~
30.0000 mL | Freq: Once | ORAL | Status: AC
  Administered 2012-08-28: 30 mL via ORAL
  Filled 2012-08-28: qty 30

## 2012-08-28 MED ORDER — ATORVASTATIN CALCIUM 20 MG PO TABS
20.0000 mg | ORAL_TABLET | Freq: Every day | ORAL | Status: DC
  Filled 2012-08-28 (×2): qty 1

## 2012-08-28 MED ORDER — MOMETASONE FURO-FORMOTEROL FUM 200-5 MCG/ACT IN AERO
2.0000 | INHALATION_SPRAY | Freq: Two times a day (BID) | RESPIRATORY_TRACT | Status: DC
  Administered 2012-08-28 – 2012-09-01 (×7): 2 via RESPIRATORY_TRACT
  Filled 2012-08-28: qty 8.8

## 2012-08-28 MED ORDER — VERAPAMIL HCL ER 240 MG PO TBCR
240.0000 mg | EXTENDED_RELEASE_TABLET | Freq: Every day | ORAL | Status: DC
  Administered 2012-08-29 – 2012-09-01 (×4): 240 mg via ORAL
  Filled 2012-08-28 (×4): qty 1

## 2012-08-28 MED ORDER — MORPHINE SULFATE 2 MG/ML IJ SOLN: 1.0000 mg | INTRAMUSCULAR | Status: DC | PRN

## 2012-08-28 MED ORDER — ACETAMINOPHEN 650 MG RE SUPP: 650.0000 mg | Freq: Four times a day (QID) | RECTAL | Status: DC | PRN

## 2012-08-28 MED ORDER — ASPIRIN EC 81 MG PO TBEC
81.0000 mg | DELAYED_RELEASE_TABLET | Freq: Every day | ORAL | Status: DC
  Administered 2012-08-29 – 2012-08-30 (×2): 81 mg via ORAL
  Filled 2012-08-28 (×3): qty 1

## 2012-08-28 MED ORDER — LEVOFLOXACIN IN D5W 500 MG/100ML IV SOLN
500.0000 mg | Freq: Once | INTRAVENOUS | Status: AC
  Administered 2012-08-28: 500 mg via INTRAVENOUS
  Filled 2012-08-28: qty 100

## 2012-08-28 NOTE — H&P (Addendum)
[facility] History and Physical  [name] ZOX:[id number] DOB: 1929-09-14 DOA: 08/28/2012  Referring physician: Dr Effie Shy PCP: Hoyle Sauer, MD   Chief Complaint:  Chest pain x 1 day  HPI:  79 year old female with history of hypertension, COPD on home O2, paroxysmal A. fib not on Coumadin due to history of GI bleed, chronic anemia, atypical chest pains, presented to the ED with acute onset of chest pain. Patient was walking around in the house when she had a sharp stabbing pain mainly over the epigastric area which was  radiating to her shoulder blade, 10/10 in intensity aggravated by sitting up and bending her knees towards the abdomen or coughing. Has associated nausea but no vomiting. Denies acid reflux.  She denies associated chest pain, palpitations, headache, blurred vision, bowel or urinary symptoms. She denies worsening shortness of breath fever or cough. She reports having similar symptoms about 6 weeks back which subsided on its warm. Patient came to the ED and she was given sublingual nitrate and 2 mg of IV morphine after which her symptoms improve to 8/10. Patient follows with Dr. [name] for her paroxysmal A. fib. She has had a cardiac cath in  2010 showing minimal disease. Vitals were unremarkable. Labs were normal. She had a troponin done in the ED which was negative. EKG showed normal sinus rhythm at 72 without ST-T changes. [facility] called for admission under observation.  Review of Systems:  Constitutional: Denies fever, chills, diaphoresis, appetite change and fatigue.  HEENT: Denies photophobia, eye pain, redness, hearing loss, ear pain, congestion, sore throat, rhinorrhea, sneezing, mouth sores, trouble swallowing, neck pain, neck stiffness and tinnitus.   Respiratory: Denies SOB, DOE, cough, chest tightness,  and wheezing.   Cardiovascular: chest pain, denies palpitations and leg swelling.  Gastrointestinal:  nausea, Denies vomiting, abdominal  pain, diarrhea, constipation, blood in stool and abdominal distention.  Genitourinary: Denies dysuria, urgency, frequency, hematuria, flank pain and difficulty urinating.  Musculoskeletal: Denies myalgias, back pain, joint swelling, arthralgias and gait problem.  Skin: Denies pallor, rash and wound.  Neurological: Denies dizziness, seizures, syncope, weakness, light-headedness, numbness and headaches.  Hematological: Denies adenopathy.  Psychiatric/Behavioral: Denies  confusion, nervousness, sleep disturbance and agitation   Past Medical History  Diagnosis Date  . Hypertension   . COPD (chronic obstructive pulmonary disease)   . Aortic insufficiency     Mild to moderate per echo in Jan 2012  . Dizziness   . PAF (paroxysmal atrial fibrillation)   . History of GI bleed   . Anemia   . DOE (dyspnea on exertion)   . Chest pain     Minimal disease per cath in 2010  . Mild aortic stenosis     Per echo in Jan 2012  . Asthma    Past Surgical History  Procedure Laterality Date  . Cardiac catheterization  03/10/2008    EF 65%; Minimal luminal irregularities with no obstructive lesions  . Cholecystectomy    . Hernia repair    . Transthoracic echocardiogram  02/05/2010    EF 55-65%; Mild AS, mild to moderate AI  . Cardiovascular stress test  02/29/2008    EF 60%   Social History:  reports that she quit smoking about 4 years ago. She does not have any smokeless tobacco history on file. She reports that she does not drink alcohol or use illicit drugs.  Allergies  Allergen Reactions  . Amoxicillin Diarrhea    Family History  Problem Relation Age of Onset  . Heart  attack Father     Prior to Admission medications   Medication Sig Start Date End Date Taking? Authorizing Provider  amitriptyline (ELAVIL) 50 MG tablet Take 50 mg by mouth at bedtime.     Yes Historical Provider, MD  aspirin 81 MG tablet Take 81 mg by mouth daily.    Yes Historical Provider, MD  atorvastatin (LIPITOR) 20 MG  tablet Take 20 mg by mouth at bedtime.    Yes Historical Provider, MD  betamethasone valerate (VALISONE) 0.1 % cream Apply topically 2 (two) times daily.   Yes Historical Provider, MD  fluticasone-salmeterol (ADVAIR HFA) 230-21 MCG/ACT inhaler Inhale 1 puff into the lungs 2 (two) times daily.   Yes Historical Provider, MD  furosemide (LASIX) 20 MG tablet Take 20 mg by mouth daily.   Yes Historical Provider, MD  lisinopril (PRINIVIL,ZESTRIL) 20 MG tablet Take 1 tablet (20 mg total) by mouth 2 (two) times daily. 03/14/12  Yes Cassell Clement, MD  metFORMIN (GLUCOPHAGE) 500 MG tablet Take 500 mg by mouth 2 (two) times daily with a meal.   Yes Historical Provider, MD  nitroGLYCERIN (NITROSTAT) 0.4 MG SL tablet Place 0.8 mg under the tongue every 5 (five) minutes as needed. For chest pain.   Yes Historical Provider, MD  omeprazole (PRILOSEC) 20 MG capsule Take 20 mg by mouth daily.    Yes Historical Provider, MD  verapamil (CALAN-SR) 240 MG CR tablet Take 1 tablet (240 mg total) by mouth daily. 11/21/11  Yes Cassell Clement, MD  albuterol (PROVENTIL HFA;VENTOLIN HFA) 108 (90 BASE) MCG/ACT inhaler Inhale 2 puffs into the lungs every 6 (six) hours as needed. For shortness of breath.    Historical Provider, MD    Physical Exam:  Filed Vitals:   08/28/12 1431 08/28/12 1500 08/28/12 1535 08/28/12 1600  BP: 116/56 117/63 125/43 125/48  Pulse: 71 69 71 66  Temp:      TempSrc:      Resp: 20 18 16 14   SpO2: 95% 99% 97% 97%    Constitutional: Vital signs reviewed.  Patient is a well-developed and well-nourished in no acute distress and cooperative with exam. Alert and oriented x3.  HEENT: No pallor, no icterus, moist oral mucosa Cardiovascular: RRR, S1 normal, S2 normal, no MRG, pulses symmetric and intact bilaterally Pulmonary/Chest: CTAB, no wheezes, rales, or rhonchi Abdominal: Soft.  non-distended, right subcostal scar from old cholecystectomy , bowel sounds are normal, tender to palpation over  epigastric area , no masses, organomegaly, or guarding present.  GU: no CVA tenderness Musculoskeletal: No joint deformities, erythema, or stiffness, ROM full and no nontender Ext: no edema and no cyanosis Neurological: A&O x3, nonfocal  Labs on Admission:  Basic Metabolic Panel:  Recent Labs Lab 08/28/12 1352  NA 141  K 4.6  CL 103  GLUCOSE 132*  BUN 16  CREATININE 1.00   Liver Function Tests: No results found for this basename: AST, ALT, ALKPHOS, BILITOT, PROT, ALBUMIN,  in the last 168 hours No results found for this basename: LIPASE, AMYLASE,  in the last 168 hours No results found for this basename: AMMONIA,  in the last 168 hours CBC:  Recent Labs Lab 08/28/12 1340 08/28/12 1352  WBC 10.4  --   NEUTROABS 8.3*  --   HGB 10.0* 10.5*  HCT 32.3* 31.0*  MCV 94.4  --   PLT 275  --    Cardiac Enzymes: No results found for this basename: CKTOTAL, CKMB, CKMBINDEX, TROPONINI,  in the last 168 hours BNP: No  components found with this basename: POCBNP,  CBG: No results found for this basename: GLUCAP,  in the last 168 hours  Radiological Exams on Admission: Dg Chest Port 1 View  08/28/2012   *RADIOLOGY REPORT*  Clinical Data: Chest pain.  PORTABLE CHEST - 1 VIEW  Comparison: 02/16/2011  Findings: New mild opacity seen in the right lower lung, suspicious for developing infiltrate versus atelectasis.  Mild atelectasis or scarring left lung base is stable.  Heart size is within normal limits.  IMPRESSION: New mild right lower lung infiltrate versus atelectasis.  Continued radiographic followup recommended.   Original Report Authenticated By: Myles Rosenthal, M.D.    EKG: Normal sinus rhythm at 70, no ST-T changes  Assessment/Plan  Active Problems:   Atypical chest pain Admit to telemetry on observation check serial troponins and EKG S/l nitrate prn for chest pain. continue home goes baby aspirin. IV morphine when necessary for pain. -check lipase and LFTs Symptoms likely  related to GERD. Given epigastric pain to palpation  Patient is on Prilosec at home. Will place her on Protonix and Maalox. zofran prn for nausea. -can be discharged home once ruled out. If symptoms worsens     HTN (hypertension) Stable Continue home medications    COPD (chronic obstructive pulmonary disease) Currently stable. Continue home O2 and inhalers  ?DM Patient on metformin. Check A1C. Place on SSI.    Atrial fibrillation Rate controlled. Continue verapamil    GERD (gastroesophageal reflux disease) Continue Prilosec  Rt lung infiltrate. CXR shows mild rt lower infiltrate vs atelectasis, patient denies worsened cough or SOB. Denies fever. Given a dose of levaquin in ED. Will not treat further.     Diet: cardiac  DVT prophylaxis: sq lovenox   Code Status: FULL code Family Communication: son Casimiro Needle at bedside Disposition Plan: Home likely in am  Eddie [facility] Pager [phone]  If 7PM-7AM, please contact night-coverage [email or web address] Password [facility] 08/28/2012, 4:44 PM    Total time spent: 55 minutes

## 2012-08-28 NOTE — ED Notes (Addendum)
Denies SOB or nausea presently. States nausea occurs when pain becomes intense. Denies fever, cold, cough. States had 2 previous similar episodes both lasting approx 4-5 hrs. Nothing makes pain worse or better.

## 2012-08-28 NOTE — ED Provider Notes (Signed)
CSN: [id number]     Arrival date & time 08/28/12  1304 History     First MD Initiated Contact with Patient 08/28/12 1305     Chief Complaint  Patient presents with  . Chest Pain   (Consider location/radiation/quality/duration/timing/severity/associated sxs/prior Treatment) HPI Comments: [name] is a 79 y.o. Female who presents by EMS for evaluation of sharp chest pain that started about 2 hours ago. The pain is persistent. She took 2 nitroglycerin tablets. She's had 2 similar episodes in the last month. She saw her PCP recently for a routine checkup. She has not seen her cardiologist recently. She takes a PPI. She's never had an upper endoscopy. She arrives by EMS, after they treated her with 2 additional nitroglycerin tablets and aspirin. Her chest pain, improved from 10-8. She had some nausea and was treated with Zofran by EMS. She denies recent illnesses, including fever, chills, diaphoresis, cough, weakness, dizziness, change in bowel or urinary habits. There are no other known modifying factors.  The history is provided by the patient.    Past Medical History  Diagnosis Date  . Hypertension   . COPD (chronic obstructive pulmonary disease)   . Aortic insufficiency     Mild to moderate per echo in Jan 2012  . Dizziness   . PAF (paroxysmal atrial fibrillation)   . History of GI bleed   . Anemia   . DOE (dyspnea on exertion)   . Chest pain     Minimal disease per cath in 2010  . Mild aortic stenosis     Per echo in Jan 2012  . Asthma    Past Surgical History  Procedure Laterality Date  . Cardiac catheterization  03/10/2008    EF 65%; Minimal luminal irregularities with no obstructive lesions  . Cholecystectomy    . Hernia repair    . Transthoracic echocardiogram  02/05/2010    EF 55-65%; Mild AS, mild to moderate AI  . Cardiovascular stress test  02/29/2008    EF 60%   Family History  Problem Relation Age of Onset  . Heart attack Father    History  Substance Use  Topics  . Smoking status: Former Smoker    Quit date: 01/11/2008  . Smokeless tobacco: Not on file  . Alcohol Use: No   OB History   Grav Para Term Preterm Abortions TAB SAB Ect Mult Living                 Review of Systems  All other systems reviewed and are negative.    Allergies  Amoxicillin  Home Medications   Current Outpatient Rx  Name  Route  Sig  Dispense  Refill  . amitriptyline (ELAVIL) 50 MG tablet   Oral   Take 50 mg by mouth at bedtime.           Marland Kitchen aspirin 81 MG tablet   Oral   Take 81 mg by mouth daily.          Marland Kitchen atorvastatin (LIPITOR) 20 MG tablet   Oral   Take 20 mg by mouth at bedtime.          . betamethasone valerate (VALISONE) 0.1 % cream   Topical   Apply topically 2 (two) times daily.         . fluticasone-salmeterol (ADVAIR HFA) 230-21 MCG/ACT inhaler   Inhalation   Inhale 1 puff into the lungs 2 (two) times daily.         . furosemide (LASIX)  20 MG tablet   Oral   Take 20 mg by mouth daily.         Marland Kitchen lisinopril (PRINIVIL,ZESTRIL) 20 MG tablet   Oral   Take 1 tablet (20 mg total) by mouth 2 (two) times daily.   60 tablet   3   . metFORMIN (GLUCOPHAGE) 500 MG tablet   Oral   Take 500 mg by mouth 2 (two) times daily with a meal.         . nitroGLYCERIN (NITROSTAT) 0.4 MG SL tablet   Sublingual   Place 0.8 mg under the tongue every 5 (five) minutes as needed. For chest pain.         Marland Kitchen omeprazole (PRILOSEC) 20 MG capsule   Oral   Take 20 mg by mouth daily.          . verapamil (CALAN-SR) 240 MG CR tablet   Oral   Take 1 tablet (240 mg total) by mouth daily.   30 tablet   5     THIS IS A NEW DOSE   . albuterol (PROVENTIL HFA;VENTOLIN HFA) 108 (90 BASE) MCG/ACT inhaler   Inhalation   Inhale 2 puffs into the lungs every 6 (six) hours as needed. For shortness of breath.          BP 125/43  Pulse 71  Temp(Src) 98.4 F (36.9 C) (Oral)  Resp 16  SpO2 97% Physical Exam  Nursing note and vitals  reviewed. Constitutional: She is oriented to person, place, and time. She appears well-developed.  Elderly, frail  HENT:  Head: Normocephalic and atraumatic.  Eyes: Conjunctivae and EOM are normal. Pupils are equal, round, and reactive to light.  Neck: Normal range of motion and phonation normal. Neck supple.  Cardiovascular: Normal rate, regular rhythm and intact distal pulses.   Pulmonary/Chest: Effort normal and breath sounds normal. She exhibits no tenderness.  Abdominal: Soft. She exhibits no distension. There is no tenderness. There is no guarding.  Musculoskeletal: Normal range of motion.  Neurological: She is alert and oriented to person, place, and time. She has normal strength. She exhibits normal muscle tone.  Skin: Skin is warm and dry.  Psychiatric: Her behavior is normal. Judgment and thought content normal.  She appears anxious    ED Course   Procedures (including critical care time)  Medications  morphine 2 MG/ML injection 2 mg (2 mg Intravenous Given 08/28/12 1534)  ondansetron (ZOFRAN) injection 4 mg (4 mg Intravenous Given 08/28/12 1337)    Patient Vitals for the past 24 hrs:  BP Temp Temp src Pulse Resp SpO2  08/28/12 1535 125/43 mmHg - - 71 16 97 %  08/28/12 1500 117/63 mmHg - - 69 18 99 %  08/28/12 1431 116/56 mmHg - - 71 20 95 %  08/28/12 1400 116/56 mmHg - - 68 14 96 %  08/28/12 1313 - - - - - 99 %  08/28/12 1312 114/59 mmHg 98.4 F (36.9 C) Oral 72 18 91 %  08/28/12 1306 - - - - - 96 %    3:56 PM Reevaluation with update and discussion. After initial assessment and treatment, an updated evaluation reveals has mid chest pain, radiating to the mid back, 6/10. She relates an occasionally productive cough. She denies fever.Mancel Bale L   3:59 PM-Consult complete with Hospitalist. Patient case explained and discussed. He agrees to admit patient for further evaluation and treatment. Call ended at 1602. Holding orders written.   Date: 08/28/12  Rate:  72  Rhythm: normal sinus rhythm  QRS Axis: normal  PR and QT Intervals: normal  ST/T Wave abnormalities: normal  PR and QRS Conduction Disutrbances:none  Narrative Interpretation:   Old EKG Reviewed: unchanged- 05/23/12   Labs Reviewed  CBC WITH DIFFERENTIAL - Abnormal; Notable for the following:    RBC 3.42 (*)    Hemoglobin 10.0 (*)    HCT 32.3 (*)    Neutrophils Relative % 80 (*)    Neutro Abs 8.3 (*)    All other components within normal limits  POCT I-STAT, CHEM 8 - Abnormal; Notable for the following:    Glucose, Bld 132 (*)    Calcium, Ion 1.10 (*)    Hemoglobin 10.5 (*)    HCT 31.0 (*)    All other components within normal limits  URINE CULTURE  URINALYSIS, ROUTINE W REFLEX MICROSCOPIC  POCT I-STAT TROPONIN I  POCT I-STAT TROPONIN I   Dg Chest Port 1 View  08/28/2012   *RADIOLOGY REPORT*  Clinical Data: Chest pain.  PORTABLE CHEST - 1 VIEW  Comparison: 02/16/2011  Findings: New mild opacity seen in the right lower lung, suspicious for developing infiltrate versus atelectasis.  Mild atelectasis or scarring left lung base is stable.  Heart size is within normal limits.  IMPRESSION: New mild right lower lung infiltrate versus atelectasis.  Continued radiographic followup recommended.   Original Report Authenticated By: Myles Rosenthal, M.D.   1. Chest pain   2. Anemia   3. Community acquired pneumonia     MDM  Nonspecific chest pain, with negative EP evaluation, including delta Troponin, measurement. Chest pain is a recurrent. She is anemic. Last cardiac catheterization was 4 years ago, and showed mild irregularities of the coronary vessels. Chest x-ray is possibly consistent with early pneumonia. Treatment begun in the ED. I will contact the hospitalist service to arrange for admission for observation and further management.  Nursing Notes Reviewed/ Care Coordinated, and agree without changes. Applicable Imaging Reviewed.  Interpretation of Laboratory Data incorporated into  ED treatment  Plan: Admit       Flint Melter, MD 08/28/12 1609

## 2012-08-28 NOTE — ED Notes (Signed)
C/o onset  midsternal CP/epigastric area pain radiating through to back while at rest. +SOB, nausea. Pt took NTG x2, ASA 324mg  & EMS gave additional NTG x2 & zofran 4mg  with minimal change.

## 2012-08-28 NOTE — ED Notes (Signed)
MD at bedside. 

## 2012-08-28 NOTE — ED Notes (Signed)
RN at the bedside 

## 2012-08-28 NOTE — ED Notes (Signed)
children at bedside.

## 2012-08-28 NOTE — Progress Notes (Signed)
ANTIBIOTIC CONSULT NOTE - INITIAL  Pharmacy Consult for Levaquin Indication: rule out pneumonia  Allergies  Allergen Reactions  . Amoxicillin Diarrhea    Per patient, weight~ 84 kg, height ~ 65 inches  Vital Signs: Temp: 98.4 F (36.9 C) (08/19 1312) Temp src: Oral (08/19 1312) BP: 125/43 mmHg (08/19 1535) Pulse Rate: 71 (08/19 1535) Intake/Output from previous day:   Intake/Output from this shift:    Labs:  Recent Labs  08/28/12 1340 08/28/12 1352  WBC 10.4  --   HGB 10.0* 10.5*  PLT 275  --   CREATININE  --  1.00   The CrCl is unknown because both a height and weight (above a minimum accepted value) are required for this calculation. No results found for this basename: VANCOTROUGH, VANCOPEAK, VANCORANDOM, GENTTROUGH, GENTPEAK, GENTRANDOM, TOBRATROUGH, TOBRAPEAK, TOBRARND, AMIKACINPEAK, AMIKACINTROU, AMIKACIN,  in the last 72 hours   Microbiology: No results found for this or any previous visit (from the past 720 hour(s)).  Medical History: Past Medical History  Diagnosis Date  . Hypertension   . COPD (chronic obstructive pulmonary disease)   . Aortic insufficiency     Mild to moderate per echo in Jan 2012  . Dizziness   . PAF (paroxysmal atrial fibrillation)   . History of GI bleed   . Anemia   . DOE (dyspnea on exertion)   . Chest pain     Minimal disease per cath in 2010  . Mild aortic stenosis     Per echo in Jan 2012  . Asthma     Medications:  See med rec   Assessment: Patient is an 79 y.o F presented to the ED with c/o CP. CXR with noted "mild right lower lung infiltrate vs atelectasis." To start levaquin for suspected PNA.   Plan:  1) levaquin 500mg  IV q24h  Mikal Wisman P 08/28/2012,3:54 PM

## 2012-08-29 ENCOUNTER — Observation Stay (HOSPITAL_COMMUNITY): Payer: Medicare Other

## 2012-08-29 DIAGNOSIS — I4891 Unspecified atrial fibrillation: Secondary | ICD-10-CM

## 2012-08-29 DIAGNOSIS — R0789 Other chest pain: Secondary | ICD-10-CM

## 2012-08-29 LAB — CBC
HCT: 31.4 % — ABNORMAL LOW (ref 36.0–46.0)
Hemoglobin: 9.8 g/dL — ABNORMAL LOW (ref 12.0–15.0)
RBC: 3.33 MIL/uL — ABNORMAL LOW (ref 3.87–5.11)
WBC: 20.7 10*3/uL — ABNORMAL HIGH (ref 4.0–10.5)

## 2012-08-29 LAB — COMPREHENSIVE METABOLIC PANEL
Alkaline Phosphatase: 201 U/L — ABNORMAL HIGH (ref 39–117)
BUN: 23 mg/dL (ref 6–23)
CO2: 30 mEq/L (ref 19–32)
Chloride: 100 mEq/L (ref 96–112)
GFR calc Af Amer: 55 mL/min — ABNORMAL LOW (ref >90)
Glucose, Bld: 102 mg/dL — ABNORMAL HIGH (ref 70–99)
Potassium: 4.5 mEq/L (ref 3.5–5.1)
Total Bilirubin: 0.8 mg/dL (ref 0.3–1.2)

## 2012-08-29 LAB — GLUCOSE, CAPILLARY: Glucose-Capillary: 118 mg/dL — ABNORMAL HIGH (ref 70–99)

## 2012-08-29 LAB — TROPONIN I
Troponin I: <0.3 ng/mL (ref <0.30)
Troponin I: <0.3 ng/mL (ref <0.30)

## 2012-08-29 LAB — HEPATITIS C ANTIBODY: HCV Ab: NEGATIVE

## 2012-08-29 LAB — URINE CULTURE
Colony Count: NO GROWTH
Culture: NO GROWTH

## 2012-08-29 LAB — HEPATITIS B SURFACE ANTIGEN: Hepatitis B Surface Ag: NEGATIVE

## 2012-08-29 MED ORDER — LEVOFLOXACIN 500 MG PO TABS
500.0000 mg | ORAL_TABLET | Freq: Every day | ORAL | Status: DC
  Administered 2012-08-29 – 2012-09-01 (×4): 500 mg via ORAL
  Filled 2012-08-29 (×4): qty 1

## 2012-08-29 NOTE — Progress Notes (Signed)
[facility] PROGRESS NOTE  [name] HQI:[id number] DOB: Dec 08, 1929 DOA: 08/28/2012 PCP: Hoyle Sauer, MD  Assessment/Plan: atypical chest pain  -Likely related to GERD  -Resolved with GI cocktail  -Continue PPI at home  -Continue aspirin 81 mg daily  -Troponins neg x 2  -EKG-nonspecific ST-T wave changes  -Myoview 08/08/2011 negative, EF 68%  Transaminasemia  -no abdominal pain, no nausea/vomiting  -may be related to lipitor  -discontinue lipitor  -Hepatic enzymes increased today -Hepatitis B surface antigen, hepatitis C antibody -Abdominal ultrasound -Recheck LFTs in the morning -follow up with Dr. Felipa Eth after discharge  Right lower lobe infiltrate  -WBC increased today -Patient without any leukocytosis, fever, cough, worsening dyspnea  -Received 1 dose Levaquin in the ED;   -Blood cultures x2 sets -Restart levofloxacin -Urinalysis without any significant pyuria Hypertension  -Stable  -Continue home medications  Diabetes mellitus type 2  -NovoLog sliding scale GERD  -Continue omeprazole  COPD  -Continue home O2 and long-acting beta agonist History of Atrial fibrillation -Remains in sinus rhythm -Continue verapamil -continue ASA  Family Communication:   daughter at beside Disposition Plan:   Home when medically stable     Procedures/Studies: Dg Chest Port 1 View  08/28/2012   *RADIOLOGY REPORT*  Clinical Data: Chest pain.  PORTABLE CHEST - 1 VIEW  Comparison: 02/16/2011  Findings: New mild opacity seen in the right lower lung, suspicious for developing infiltrate versus atelectasis.  Mild atelectasis or scarring left lung base is stable.  Heart size is within normal limits.  IMPRESSION: New mild right lower lung infiltrate versus atelectasis.  Continued radiographic followup recommended.   Original Report Authenticated By: Myles Rosenthal, M.D.         Subjective: Patient states that chest discomfort has resolved with GI cocktail. Denies any  fevers, chills, shortness breath, nausea, vomiting, diarrhea, abdominal pain, dysuria.  Objective: Filed Vitals:   08/28/12 2049 08/29/12 0046 08/29/12 0546 08/29/12 0907  BP: 152/63  105/50   Pulse: 98  78   Temp: 99.5 F (37.5 C)  98.8 F (37.1 C)   TempSrc: Oral  Oral   Resp: 20  20   Height:      Weight:  86.728 kg (191 lb 3.2 oz)    SpO2: 91%  94% 94%    Intake/Output Summary (Last 24 hours) at 08/29/12 1222 Last data filed at 08/29/12 0900  Gross per 24 hour  Intake    360 ml  Output    300 ml  Net     60 ml   Weight change:  Exam:   General:  Pt is alert, follows commands appropriately, not in acute distress  HEENT: No icterus, No thrush, No neck mass, [place]/AT  Cardiovascular: RRR, S1/S2, no rubs, no gallops  Respiratory: CTA bilaterally, no wheezing, no crackles, no rhonchi  Abdomen: Soft/+BS, non tender, non distended, no guarding  Extremities: No edema, No lymphangitis, No petechiae, No rashes, no synovitis  Data Reviewed: Basic Metabolic Panel:  Recent Labs Lab 08/28/12 1352 08/29/12 0830  NA 141 138  K 4.6 4.5  CL 103 100  CO2  --  30  GLUCOSE 132* 102*  BUN 16 23  CREATININE 1.00 1.07  CALCIUM  --  8.5   Liver Function Tests:  Recent Labs Lab 08/28/12 1715 08/29/12 0830  AST 244* 380*  ALT 119* 367*  ALKPHOS 159* 201*  BILITOT 0.5 0.8  PROT 5.5* 5.4*  ALBUMIN 3.1* 2.9*    Recent Labs Lab 08/28/12 1715  LIPASE 24   No results found for this basename: AMMONIA,  in the last 168 hours CBC:  Recent Labs Lab 08/28/12 1340 08/28/12 1352 08/29/12 0830  WBC 10.4  --  20.7*  NEUTROABS 8.3*  --   --   HGB 10.0* 10.5* 9.8*  HCT 32.3* 31.0* 31.4*  MCV 94.4  --  94.3  PLT 275  --  270   Cardiac Enzymes:  Recent Labs Lab 08/28/12 1940 08/29/12 0240 08/29/12 0830  TROPONINI <0.30 <0.30 <0.30   BNP: No components found with this basename: POCBNP,  CBG:  Recent Labs Lab 08/28/12 1902 08/28/12 2047 08/29/12 0736  08/29/12 1121  GLUCAP 112* 112* 91 102*    Recent Results (from the past 240 hour(s))  URINE CULTURE     Status: None   Collection Time    08/28/12  3:26 PM      Result Value Range Status   Specimen Description URINE, CLEAN CATCH   Final   Special Requests NONE   Final   Culture  Setup Time     Final   Value: 08/28/2012 15:51     Performed at [facility] Count     Final   Value: NO GROWTH     Performed at [facility]   Culture     Final   Value: NO GROWTH     Performed at [facility]   Report Status 08/29/2012 FINAL   Final     Scheduled Meds: . amitriptyline  50 mg Oral QHS  . aspirin EC  81 mg Oral Daily  . enoxaparin (LOVENOX) injection  40 mg Subcutaneous Q24H  . furosemide  20 mg Oral Daily  . insulin aspart  0-15 Units Subcutaneous TID WC  . levofloxacin  500 mg Oral Daily  . lisinopril  20 mg Oral BID  . mometasone-formoterol  2 puff Inhalation BID  . pantoprazole  40 mg Oral Daily  . sodium chloride  3 mL Intravenous Q12H  . verapamil  240 mg Oral Daily   Continuous Infusions:    Shaletha Humble, DO  [facility] Pager [phone]  If 7PM-7AM, please contact night-coverage [email or web address] Password [email or web address] 08/29/2012, 12:22 PM   LOS: 1 day

## 2012-08-30 DIAGNOSIS — K8050 Calculus of bile duct without cholangitis or cholecystitis without obstruction: Secondary | ICD-10-CM | POA: Diagnosis present

## 2012-08-30 LAB — CBC
HCT: 30.9 % — ABNORMAL LOW (ref 36.0–46.0)
Hemoglobin: 9.5 g/dL — ABNORMAL LOW (ref 12.0–15.0)
MCH: 29.3 pg (ref 26.0–34.0)
MCHC: 30.7 g/dL (ref 30.0–36.0)
RDW: 14 % (ref 11.5–15.5)

## 2012-08-30 LAB — COMPREHENSIVE METABOLIC PANEL
ALT: 226 U/L — ABNORMAL HIGH (ref 0–35)
AST: 118 U/L — ABNORMAL HIGH (ref 0–37)
Alkaline Phosphatase: 176 U/L — ABNORMAL HIGH (ref 39–117)
CO2: 29 mEq/L (ref 19–32)
GFR calc Af Amer: 63 mL/min — ABNORMAL LOW (ref >90)
Glucose, Bld: 130 mg/dL — ABNORMAL HIGH (ref 70–99)
Potassium: 4.8 mEq/L (ref 3.5–5.1)
Sodium: 140 mEq/L (ref 135–145)
Total Protein: 5.6 g/dL — ABNORMAL LOW (ref 6.0–8.3)

## 2012-08-30 LAB — SURGICAL PCR SCREEN: Staphylococcus aureus: NEGATIVE

## 2012-08-30 LAB — GLUCOSE, CAPILLARY: Glucose-Capillary: 114 mg/dL — ABNORMAL HIGH (ref 70–99)

## 2012-08-30 MED ORDER — CIPROFLOXACIN IN D5W 400 MG/200ML IV SOLN
400.0000 mg | Freq: Once | INTRAVENOUS | Status: AC
  Administered 2012-08-31: 400 mg via INTRAVENOUS
  Filled 2012-08-30 (×2): qty 200

## 2012-08-30 MED ORDER — LORAZEPAM 0.5 MG PO TABS
0.5000 mg | ORAL_TABLET | Freq: Once | ORAL | Status: AC
  Administered 2012-08-30: 0.5 mg via ORAL
  Filled 2012-08-30: qty 1

## 2012-08-30 MED ORDER — POLYETHYLENE GLYCOL 3350 17 G PO PACK
17.0000 g | PACK | Freq: Every day | ORAL | Status: DC
  Administered 2012-08-31 – 2012-09-01 (×2): 17 g via ORAL
  Filled 2012-08-30 (×3): qty 1

## 2012-08-30 MED ORDER — DOCUSATE SODIUM 100 MG PO CAPS
100.0000 mg | ORAL_CAPSULE | Freq: Two times a day (BID) | ORAL | Status: DC
  Administered 2012-08-30 – 2012-09-01 (×4): 100 mg via ORAL
  Filled 2012-08-30 (×6): qty 1

## 2012-08-30 NOTE — Plan of Care (Signed)
Problem: Problem: Bowel/Bladder Progression Goal: OTHER BOWEL GOAL(S) Outcome: Progressing GI consulted and plans for ERCP in the am.

## 2012-08-30 NOTE — Consult Note (Signed)
[name] Reason for consult:probable common duct stone Referring Physician: [name]. PCP: [name] is an 79 y.o. female.  HPI: patient well known to me. She has a history of atrial fibrillation and has been followed by Dr [name] in the past. She has had intermittent issues with G.I. Bleeding and has not been on Coumadin for that reason. She has COPD and is on oxygen at night only. She notes that she is able to walk around the house and yard without any problems. Over the past several weeks she has had 3 distinct episodes of substernal/epigastric pain. In between his episode she was asymptomatic. Each of the episodes came on after eating breakfast. They did not appear to be exertion related. She came in to the emergency room after the 3rd episode which was the most severe.she had the marked elevation of LFT's transaminases over 300 elevated out possible normal total bilirubin. WBC 21.patient had slight infiltrate versus atelectasis: chest x-ray and was placed on oral Levaquin. She reports that she is now pain-free. Ultrasound of the abdomen was performed and revealed what was felt to be as shadowing common bile duct stone. The patient is post cholecystectomy. She states that she feels much better and is having no pain at this time but is afraid that she may have " another attack ".  Past Medical History  Diagnosis Date  . Hypertension   . COPD (chronic obstructive pulmonary disease)   . Aortic insufficiency     Mild to moderate per echo in Jan 2012  . Dizziness   . PAF (paroxysmal atrial fibrillation)   . History of GI bleed   . Anemia   . DOE (dyspnea on exertion)   . Chest pain     Minimal disease per cath in 2010  . Mild aortic stenosis     Per echo in Jan 2012  . Asthma     Past Surgical History  Procedure Laterality Date  . Cardiac catheterization  03/10/2008    EF 65%; Minimal luminal irregularities with no obstructive lesions  .  Cholecystectomy    . Hernia repair    . Transthoracic echocardiogram  02/05/2010    EF 55-65%; Mild AS, mild to moderate AI  . Cardiovascular stress test  02/29/2008    EF 60%    Family History  Problem Relation Age of Onset  . Heart attack Father     Social History:  reports that she quit smoking about 4 years ago. She does not have any smokeless tobacco history on file. She reports that she does not drink alcohol or use illicit drugs.  Allergies:  Allergies  Allergen Reactions  . Amoxicillin Diarrhea    Medications; . amitriptyline  50 mg Oral QHS  . aspirin EC  81 mg Oral Daily  . enoxaparin (LOVENOX) injection  40 mg Subcutaneous Q24H  . furosemide  20 mg Oral Daily  . insulin aspart  0-15 Units Subcutaneous TID WC  . levofloxacin  500 mg Oral Daily  . lisinopril  20 mg Oral BID  . mometasone-formoterol  2 puff Inhalation BID  . pantoprazole  40 mg Oral Daily  . sodium chloride  3 mL Intravenous Q12H  . verapamil  240 mg Oral Daily   PRN Meds acetaminophen, acetaminophen, albuterol, alum & mag hydroxide-simeth, morphine injection, nitroGLYCERIN, ondansetron (ZOFRAN) IV, ondansetron Results for orders placed during the hospital encounter of 08/28/12 (from the past 48 hour(s))  CBC WITH DIFFERENTIAL  Status: Abnormal   Collection Time    08/28/12  1:40 PM      Result Value Range   WBC 10.4  4.0 - 10.5 K/uL   RBC 3.42 (*) 3.87 - 5.11 MIL/uL   Hemoglobin 10.0 (*) 12.0 - 15.0 g/dL   HCT 96.0 (*) 45.4 - 09.8 %   MCV 94.4  78.0 - 100.0 fL   MCH 29.2  26.0 - 34.0 pg   MCHC 31.0  30.0 - 36.0 g/dL   RDW 11.9  14.7 - 82.9 %   Platelets 275  150 - 400 K/uL   Neutrophils Relative % 80 (*) 43 - 77 %   Neutro Abs 8.3 (*) 1.7 - 7.7 K/uL   Lymphocytes Relative 13  12 - 46 %   Lymphs Abs 1.4  0.7 - 4.0 K/uL   Monocytes Relative 6  3 - 12 %   Monocytes Absolute 0.7  0.1 - 1.0 K/uL   Eosinophils Relative 1  0 - 5 %   Eosinophils Absolute 0.1  0.0 - 0.7 K/uL   Basophils  Relative 0  0 - 1 %   Basophils Absolute 0.0  0.0 - 0.1 K/uL  POCT I-STAT TROPONIN I     Status: None   Collection Time    08/28/12  1:49 PM      Result Value Range   Troponin i, poc 0.01  0.00 - 0.08 ng/mL   Comment 3            Comment: Due to the release kinetics of cTnI,     a negative result within the first hours     of the onset of symptoms does not rule out     myocardial infarction with certainty.     If myocardial infarction is still suspected,     repeat the test at appropriate intervals.  POCT I-STAT, CHEM 8     Status: Abnormal   Collection Time    08/28/12  1:52 PM      Result Value Range   Sodium 141  135 - 145 mEq/L   Potassium 4.6  3.5 - 5.1 mEq/L   Chloride 103  96 - 112 mEq/L   BUN 16  6 - 23 mg/dL   Creatinine, Ser 5.62  0.50 - 1.10 mg/dL   Glucose, Bld 130 (*) 70 - 99 mg/dL   Calcium, Ion 8.65 (*) 1.13 - 1.30 mmol/L   TCO2 30  0 - 100 mmol/L   Hemoglobin 10.5 (*) 12.0 - 15.0 g/dL   HCT 78.4 (*) 69.6 - 29.5 %  POCT I-STAT TROPONIN I     Status: None   Collection Time    08/28/12  3:11 PM      Result Value Range   Troponin i, poc 0.01  0.00 - 0.08 ng/mL   Comment 3            Comment: Due to the release kinetics of cTnI,     a negative result within the first hours     of the onset of symptoms does not rule out     myocardial infarction with certainty.     If myocardial infarction is still suspected,     repeat the test at appropriate intervals.  URINALYSIS, ROUTINE W REFLEX MICROSCOPIC     Status: Abnormal   Collection Time    08/28/12  3:26 PM      Result Value Range   Color, Urine YELLOW  YELLOW   APPearance  CLEAR  CLEAR   Specific Gravity, Urine 1.016  1.005 - 1.030   pH 6.5  5.0 - 8.0   Glucose, UA NEGATIVE  NEGATIVE mg/dL   Hgb urine dipstick NEGATIVE  NEGATIVE   Bilirubin Urine NEGATIVE  NEGATIVE   Ketones, ur NEGATIVE  NEGATIVE mg/dL   Protein, ur NEGATIVE  NEGATIVE mg/dL   Urobilinogen, UA 1.0  0.0 - 1.0 mg/dL   Nitrite NEGATIVE   NEGATIVE   Leukocytes, UA SMALL (*) NEGATIVE  URINE CULTURE     Status: None   Collection Time    08/28/12  3:26 PM      Result Value Range   Specimen Description URINE, CLEAN CATCH     Special Requests NONE     Culture  Setup Time       Value: 08/28/2012 15:51     Performed at [facility] Count       Value: NO GROWTH     Performed at [facility]   Culture       Value: NO GROWTH     Performed at [facility]   Report Status 08/29/2012 FINAL    URINE MICROSCOPIC-ADD ON     Status: Abnormal   Collection Time    08/28/12  3:26 PM      Result Value Range   Squamous Epithelial / LPF FEW (*) RARE   WBC, UA 3-6  <3 WBC/hpf   RBC / HPF 0-2  <3 RBC/hpf   Casts HYALINE CASTS (*) NEGATIVE  HEPATIC FUNCTION PANEL     Status: Abnormal   Collection Time    08/28/12  5:15 PM      Result Value Range   Total Protein 5.5 (*) 6.0 - 8.3 g/dL   Albumin 3.1 (*) 3.5 - 5.2 g/dL   AST 914 (*) 0 - 37 U/L   ALT 119 (*) 0 - 35 U/L   Alkaline Phosphatase 159 (*) 39 - 117 U/L   Total Bilirubin 0.5  0.3 - 1.2 mg/dL   Bilirubin, Direct 0.3  0.0 - 0.3 mg/dL   Indirect Bilirubin 0.2 (*) 0.3 - 0.9 mg/dL  LIPASE, BLOOD     Status: None   Collection Time    08/28/12  5:15 PM      Result Value Range   Lipase 24  11 - 59 U/L  GLUCOSE, CAPILLARY     Status: Abnormal   Collection Time    08/28/12  7:02 PM      Result Value Range   Glucose-Capillary 112 (*) 70 - 99 mg/dL   Comment 1 Documented in Chart     Comment 2 Notify RN    TROPONIN I     Status: None   Collection Time    08/28/12  7:40 PM      Result Value Range   Troponin I <0.30  <0.30 ng/mL   Comment:            Due to the release kinetics of cTnI,     a negative result within the first hours     of the onset of symptoms does not rule out     myocardial infarction with certainty.     If myocardial infarction is still suspected,     repeat the test at appropriate intervals.  GLUCOSE, CAPILLARY      Status: Abnormal   Collection Time    08/28/12  8:47 PM      Result Value Range  Glucose-Capillary 112 (*) 70 - 99 mg/dL   Comment 1 Notify RN    TROPONIN I     Status: None   Collection Time    08/29/12  2:40 AM      Result Value Range   Troponin I <0.30  <0.30 ng/mL   Comment:            Due to the release kinetics of cTnI,     a negative result within the first hours     of the onset of symptoms does not rule out     myocardial infarction with certainty.     If myocardial infarction is still suspected,     repeat the test at appropriate intervals.  GLUCOSE, CAPILLARY     Status: None   Collection Time    08/29/12  7:36 AM      Result Value Range   Glucose-Capillary 91  70 - 99 mg/dL   Comment 1 Documented in Chart     Comment 2 Notify RN    TROPONIN I     Status: None   Collection Time    08/29/12  8:30 AM      Result Value Range   Troponin I <0.30  <0.30 ng/mL   Comment:            Due to the release kinetics of cTnI,     a negative result within the first hours     of the onset of symptoms does not rule out     myocardial infarction with certainty.     If myocardial infarction is still suspected,     repeat the test at appropriate intervals.  CBC     Status: Abnormal   Collection Time    08/29/12  8:30 AM      Result Value Range   WBC 20.7 (*) 4.0 - 10.5 K/uL   RBC 3.33 (*) 3.87 - 5.11 MIL/uL   Hemoglobin 9.8 (*) 12.0 - 15.0 g/dL   HCT 16.1 (*) 09.6 - 04.5 %   MCV 94.3  78.0 - 100.0 fL   MCH 29.4  26.0 - 34.0 pg   MCHC 31.2  30.0 - 36.0 g/dL   RDW 40.9  81.1 - 91.4 %   Platelets 270  150 - 400 K/uL  COMPREHENSIVE METABOLIC PANEL     Status: Abnormal   Collection Time    08/29/12  8:30 AM      Result Value Range   Sodium 138  135 - 145 mEq/L   Potassium 4.5  3.5 - 5.1 mEq/L   Chloride 100  96 - 112 mEq/L   CO2 30  19 - 32 mEq/L   Glucose, Bld 102 (*) 70 - 99 mg/dL   BUN 23  6 - 23 mg/dL   Creatinine, Ser 7.82  0.50 - 1.10 mg/dL   Calcium 8.5  8.4 -  95.6 mg/dL   Total Protein 5.4 (*) 6.0 - 8.3 g/dL   Albumin 2.9 (*) 3.5 - 5.2 g/dL   AST 213 (*) 0 - 37 U/L   ALT 367 (*) 0 - 35 U/L   Alkaline Phosphatase 201 (*) 39 - 117 U/L   Total Bilirubin 0.8  0.3 - 1.2 mg/dL   GFR calc non Af Amer 47 (*) >90 mL/min   GFR calc Af Amer 55 (*) >90 mL/min   Comment: (NOTE)     The eGFR has been calculated using the CKD EPI equation.     This  calculation has not been validated in all clinical situations.     eGFR's persistently <90 mL/min signify possible Chronic Kidney     Disease.  GLUCOSE, CAPILLARY     Status: Abnormal   Collection Time    08/29/12 11:21 AM      Result Value Range   Glucose-Capillary 102 (*) 70 - 99 mg/dL   Comment 1 Documented in Chart     Comment 2 Notify RN    CULTURE, BLOOD (ROUTINE X 2)     Status: None   Collection Time    08/29/12 12:15 PM      Result Value Range   Specimen Description BLOOD LEFT ANTECUBITAL     Special Requests BOTTLES DRAWN AEROBIC ONLY 10CC     Culture  Setup Time       Value: 08/29/2012 16:10     Performed at [facility]   Culture       Value:        BLOOD CULTURE RECEIVED NO GROWTH TO DATE CULTURE WILL BE HELD FOR 5 DAYS BEFORE ISSUING A FINAL NEGATIVE REPORT     Performed at [facility]   Report Status PENDING    CULTURE, BLOOD (ROUTINE X 2)     Status: None   Collection Time    08/29/12 12:30 PM      Result Value Range   Specimen Description BLOOD RIGHT HAND     Special Requests BOTTLES DRAWN AEROBIC ONLY 5CC     Culture  Setup Time       Value: 08/29/2012 16:11     Performed at [facility]   Culture       Value:        BLOOD CULTURE RECEIVED NO GROWTH TO DATE CULTURE WILL BE HELD FOR 5 DAYS BEFORE ISSUING A FINAL NEGATIVE REPORT     Performed at [facility]   Report Status PENDING    HEPATITIS B SURFACE ANTIGEN     Status: None   Collection Time    08/29/12 12:30 PM      Result Value Range   Hepatitis B Surface Ag NEGATIVE  NEGATIVE    Comment: Performed at [facility]  HEPATITIS C ANTIBODY     Status: None   Collection Time    08/29/12 12:30 PM      Result Value Range   HCV Ab NEGATIVE  NEGATIVE   Comment: Performed at [facility]  GLUCOSE, CAPILLARY     Status: Abnormal   Collection Time    08/29/12  4:22 PM      Result Value Range   Glucose-Capillary 118 (*) 70 - 99 mg/dL   Comment 1 Documented in Chart     Comment 2 Notify RN    GLUCOSE, CAPILLARY     Status: Abnormal   Collection Time    08/29/12  9:06 PM      Result Value Range   Glucose-Capillary 124 (*) 70 - 99 mg/dL  COMPREHENSIVE METABOLIC PANEL     Status: Abnormal   Collection Time    08/30/12  4:00 AM      Result Value Range   Sodium 140  135 - 145 mEq/L   Potassium 4.8  3.5 - 5.1 mEq/L   Chloride 103  96 - 112 mEq/L   CO2 29  19 - 32 mEq/L   Glucose, Bld 130 (*) 70 - 99 mg/dL   BUN 25 (*) 6 - 23 mg/dL  Creatinine, Ser 0.96  0.50 - 1.10 mg/dL   Calcium 8.6  8.4 - 09.8 mg/dL   Total Protein 5.6 (*) 6.0 - 8.3 g/dL   Albumin 2.7 (*) 3.5 - 5.2 g/dL   AST 119 (*) 0 - 37 U/L   ALT 226 (*) 0 - 35 U/L   Alkaline Phosphatase 176 (*) 39 - 117 U/L   Total Bilirubin 0.3  0.3 - 1.2 mg/dL   GFR calc non Af Amer 54 (*) >90 mL/min   GFR calc Af Amer 63 (*) >90 mL/min   Comment: (NOTE)     The eGFR has been calculated using the CKD EPI equation.     This calculation has not been validated in all clinical situations.     eGFR's persistently <90 mL/min signify possible Chronic Kidney     Disease.  CBC     Status: Abnormal   Collection Time    08/30/12  4:00 AM      Result Value Range   WBC 12.8 (*) 4.0 - 10.5 K/uL   RBC 3.24 (*) 3.87 - 5.11 MIL/uL   Hemoglobin 9.5 (*) 12.0 - 15.0 g/dL   HCT 14.7 (*) 82.9 - 56.2 %   MCV 95.4  78.0 - 100.0 fL   MCH 29.3  26.0 - 34.0 pg   MCHC 30.7  30.0 - 36.0 g/dL   RDW 13.0  86.5 - 78.4 %   Platelets 229  150 - 400 K/uL  GLUCOSE, CAPILLARY     Status: Abnormal   Collection Time    08/30/12   7:34 AM      Result Value Range   Glucose-Capillary 114 (*) 70 - 99 mg/dL    US Abdomen Complete  08/29/2012   *RADIOLOGY REPORT*  Clinical Data:  Elevated hepatic enzymes.  Previous cholecystectomy.  COMPLETE ABDOMINAL ULTRASOUND  Comparison:  CT 11/16/2008  Findings:  Gallbladder:  Surgically absent  Common bile duct:  9.6 mm in diameter.  There is a mobile 18 mm shadowing calculus in the distal common duct.  Liver:  No focal lesion identified.  Within normal limits in parenchymal echogenicity.  IVC:  Appears normal.  Pancreas:  No focal abnormality seen.  Spleen:  7.6 cm craniocaudal length, unremarkable.  Right Kidney:  10.4 cm in length.  11 mm partially exophytic simple appearing cyst from the lower pole.  No solid renal lesion.  No hydronephrosis.  Left Kidney:   11.2 cm. No hydronephrosis.  Well-preserved cortex. Normal size and parenchymal echotexture without focal abnormalities.   Abdominal aorta:  Visualized segments unremarkable without aneurysm.  IMPRESSION: 1.  Choledocholithiasis without intrahepatic biliary ductal dilatation.   Original Report Authenticated By: D. Andria Rhein, MD   Dg Chest Port 1 View  08/28/2012   *RADIOLOGY REPORT*  Clinical Data: Chest pain.  PORTABLE CHEST - 1 VIEW  Comparison: 02/16/2011  Findings: New mild opacity seen in the right lower lung, suspicious for developing infiltrate versus atelectasis.  Mild atelectasis or scarring left lung base is stable.  Heart size is within normal limits.  IMPRESSION: New mild right lower lung infiltrate versus atelectasis.  Continued radiographic followup recommended.   Original Report Authenticated By: Myles Rosenthal, M.D.               Blood pressure 113/75, pulse 76, temperature 98.3 F (36.8 C), temperature source Oral, resp. rate 18, height 5\' 5"  (1.651 m), weight 86.728 kg (191 lb 3.2 oz), SpO2 91.00%.  Physical exam:   General--pleasant white female  in no distress. Watching TV and joking with her  family. Heart--regular rate and rhythm without murmurs are gallops Lungs--diminish breath sounds Abdomen--none distended and completely nontender today.   Assessment: 1. Episodic Epigastric Pain. Her history and presentation are quite typical for common bile duct stone floating in the bile duct. Her liver tests are improved and WBC diminished with oral antibiotics. Ultrasound strongly suggest CBD stones. She is currently asymptomatic. Suspect that the finding of the chest x-ray is more atelectasis in actual pneumonia given her clinical history. 2. COPD. She is on oxygen that night but apparently does well during the day without supplemental oxygen 3. History of atrial fibrillation 4. Status post cholecystectomy  Plan: 1.feel patient should probably have ERCP and stone extraction. Timing of the procedure unclear at this time. We'll discuss further with Dr. Dulce Sellar. I have discussed the procedure including the risk and benefits with the patient and her family today. Hopefully we can get it scheduled the next day or so.   Jenaye Rickert JR,Nhyla Nappi L 08/30/2012, 9:33 AM

## 2012-08-30 NOTE — Progress Notes (Signed)
[facility] PROGRESS NOTE  [name] JXB:[id number] DOB: Mar 26, 1929 DOA: 08/28/2012 PCP: Hoyle Sauer, MD  Assessment/Plan: atypical chest pain  -Likely related to GERD  -Resolved with GI cocktail  -Continue PPI at home  -Continue aspirin 81 mg daily  -Troponins neg x 2  -EKG-nonspecific ST-T wave changes  -Myoview 08/08/2011 negative, EF 68%  Transaminasemia  -no abdominal pain, no nausea/vomiting  -may be related to lipitor  -discontinue lipitor  -Hepatic enzymes with some improvement 08/30/12  -Hepatitis B surface antigen and hepatitis C antibody--negative -Abdominal ultrasound --showed choledocholithiasis -consulted [facility]-->ERCP planned 08/31/12 -Recheck LFTs in the morning  Right lower lobe infiltrate  -WBC increased 08/29/12-->improving on 08/30/12 -Received 1 dose Levaquin in the ED;  -Blood cultures x2 sets  -Restart levofloxacin, day 3 of 7  -Urinalysis without any significant pyuria  Hypertension  -Stable  -Continue home medications  Diabetes mellitus type 2  -NovoLog sliding scale  -Patient was on metformin at home -Hemoglobin A1c GERD  -Continue omeprazole  COPD  -Continue home O2 and long-acting beta agonist  History of Atrial fibrillation  -Remains in sinus rhythm  -Continue verapamil  -continue ASA  Family Communication: daughter at beside  Disposition Plan: Home when cleared by GI  Antibiotics:  Levofloxacin 08/28/2012>>>          Procedures/Studies: US Abdomen Complete  08/29/2012   *RADIOLOGY REPORT*  Clinical Data:  Elevated hepatic enzymes.  Previous cholecystectomy.  COMPLETE ABDOMINAL ULTRASOUND  Comparison:  CT 11/16/2008  Findings:  Gallbladder:  Surgically absent  Common bile duct:  9.6 mm in diameter.  There is a mobile 18 mm shadowing calculus in the distal common duct.  Liver:  No focal lesion identified.  Within normal limits in parenchymal echogenicity.  IVC:  Appears normal.  Pancreas:  No focal abnormality seen.   Spleen:  7.6 cm craniocaudal length, unremarkable.  Right Kidney:  10.4 cm in length.  11 mm partially exophytic simple appearing cyst from the lower pole.  No solid renal lesion.  No hydronephrosis.  Left Kidney:   11.2 cm. No hydronephrosis.  Well-preserved cortex. Normal size and parenchymal echotexture without focal abnormalities.   Abdominal aorta:  Visualized segments unremarkable without aneurysm.  IMPRESSION: 1.  Choledocholithiasis without intrahepatic biliary ductal dilatation.   Original Report Authenticated By: D. Andria Rhein, MD   Dg Chest Port 1 View  08/28/2012   *RADIOLOGY REPORT*  Clinical Data: Chest pain.  PORTABLE CHEST - 1 VIEW  Comparison: 02/16/2011  Findings: New mild opacity seen in the right lower lung, suspicious for developing infiltrate versus atelectasis.  Mild atelectasis or scarring left lung base is stable.  Heart size is within normal limits.  IMPRESSION: New mild right lower lung infiltrate versus atelectasis.  Continued radiographic followup recommended.   Original Report Authenticated By: Myles Rosenthal, M.D.         Subjective: Patient denies fevers, chills, chest discomfort, shortness breath, nausea, vomiting, diarrhea, abdominal pain, dysuria, hematuria, rashes. Denies any dizziness or syncope  Objective: Filed Vitals:   08/29/12 1300 08/29/12 2100 08/30/12 0541 08/30/12 1040  BP: 110/70 111/43 113/75 152/70  Pulse: 75 64 76   Temp: 99.6 F (37.6 C) 98.4 F (36.9 C) 98.3 F (36.8 C)   TempSrc: Oral Oral Oral   Resp: 20 18 18    Height:      Weight:      SpO2: 91% 91% 91%     Intake/Output Summary (Last 24 hours) at 08/30/12 1244 Last data filed at 08/30/12  0800  Gross per 24 hour  Intake    940 ml  Output   1000 ml  Net    -60 ml   Weight change:  Exam:   General:  Pt is alert, follows commands appropriately, not in acute distress  HEENT: No icterus, No thrush,  [place]/AT  Cardiovascular: RRR, S1/S2, no rubs, no gallops  Respiratory: CTA  bilaterally, no wheezing, no crackles, no rhonchi  Abdomen: Soft/+BS, non tender, non distended, no guarding  Extremities: No edema, No lymphangitis, No petechiae, No rashes, no synovitis  Data Reviewed: Basic Metabolic Panel:  Recent Labs Lab 08/28/12 1352 08/29/12 0830 08/30/12 0400  NA 141 138 140  K 4.6 4.5 4.8  CL 103 100 103  CO2  --  30 29  GLUCOSE 132* 102* 130*  BUN 16 23 25*  CREATININE 1.00 1.07 0.96  CALCIUM  --  8.5 8.6   Liver Function Tests:  Recent Labs Lab 08/28/12 1715 08/29/12 0830 08/30/12 0400  AST 244* 380* 118*  ALT 119* 367* 226*  ALKPHOS 159* 201* 176*  BILITOT 0.5 0.8 0.3  PROT 5.5* 5.4* 5.6*  ALBUMIN 3.1* 2.9* 2.7*    Recent Labs Lab 08/28/12 1715  LIPASE 24   No results found for this basename: AMMONIA,  in the last 168 hours CBC:  Recent Labs Lab 08/28/12 1340 08/28/12 1352 08/29/12 0830 08/30/12 0400  WBC 10.4  --  20.7* 12.8*  NEUTROABS 8.3*  --   --   --   HGB 10.0* 10.5* 9.8* 9.5*  HCT 32.3* 31.0* 31.4* 30.9*  MCV 94.4  --  94.3 95.4  PLT 275  --  270 229   Cardiac Enzymes:  Recent Labs Lab 08/28/12 1940 08/29/12 0240 08/29/12 0830  TROPONINI <0.30 <0.30 <0.30   BNP: No components found with this basename: POCBNP,  CBG:  Recent Labs Lab 08/29/12 0736 08/29/12 1121 08/29/12 1622 08/29/12 2106 08/30/12 0734  GLUCAP 91 102* 118* 124* 114*    Recent Results (from the past 240 hour(s))  URINE CULTURE     Status: None   Collection Time    08/28/12  3:26 PM      Result Value Range Status   Specimen Description URINE, CLEAN CATCH   Final   Special Requests NONE   Final   Culture  Setup Time     Final   Value: 08/28/2012 15:51     Performed at [facility] Count     Final   Value: NO GROWTH     Performed at [facility]   Culture     Final   Value: NO GROWTH     Performed at [facility]   Report Status 08/29/2012 FINAL   Final  CULTURE, BLOOD (ROUTINE X 2)      Status: None   Collection Time    08/29/12 12:15 PM      Result Value Range Status   Specimen Description BLOOD LEFT ANTECUBITAL   Final   Special Requests BOTTLES DRAWN AEROBIC ONLY 10CC   Final   Culture  Setup Time     Final   Value: 08/29/2012 16:10     Performed at [facility]   Culture     Final   Value:        BLOOD CULTURE RECEIVED NO GROWTH TO DATE CULTURE WILL BE HELD FOR 5 DAYS BEFORE ISSUING A FINAL NEGATIVE REPORT     Performed at [facility]  Report Status PENDING   Incomplete  CULTURE, BLOOD (ROUTINE X 2)     Status: None   Collection Time    08/29/12 12:30 PM      Result Value Range Status   Specimen Description BLOOD RIGHT HAND   Final   Special Requests BOTTLES DRAWN AEROBIC ONLY 5CC   Final   Culture  Setup Time     Final   Value: 08/29/2012 16:11     Performed at [facility]   Culture     Final   Value:        BLOOD CULTURE RECEIVED NO GROWTH TO DATE CULTURE WILL BE HELD FOR 5 DAYS BEFORE ISSUING A FINAL NEGATIVE REPORT     Performed at [facility]   Report Status PENDING   Incomplete     Scheduled Meds: . amitriptyline  50 mg Oral QHS  . aspirin EC  81 mg Oral Daily  . docusate sodium  100 mg Oral BID  . enoxaparin (LOVENOX) injection  40 mg Subcutaneous Q24H  . furosemide  20 mg Oral Daily  . insulin aspart  0-15 Units Subcutaneous TID WC  . levofloxacin  500 mg Oral Daily  . lisinopril  20 mg Oral BID  . mometasone-formoterol  2 puff Inhalation BID  . pantoprazole  40 mg Oral Daily  . polyethylene glycol  17 g Oral Daily  . sodium chloride  3 mL Intravenous Q12H  . verapamil  240 mg Oral Daily   Continuous Infusions:    Sheri Salser, DO  [facility] Pager [phone]  If 7PM-7AM, please contact night-coverage [email or web address] Password [facility] 08/30/2012, 12:44 PM   LOS: 2 days

## 2012-08-30 NOTE — Progress Notes (Signed)
Utilization review completed.  

## 2012-08-31 ENCOUNTER — Observation Stay (HOSPITAL_COMMUNITY): Payer: Medicare Other | Admitting: Anesthesiology

## 2012-08-31 ENCOUNTER — Encounter (HOSPITAL_COMMUNITY)
Admission: EM | Disposition: A | Discharge status: Home / Self Care | Payer: Self-pay | Source: Home / Self Care | Attending: Emergency Medicine

## 2012-08-31 ENCOUNTER — Observation Stay (HOSPITAL_COMMUNITY): Payer: Medicare Other

## 2012-08-31 ENCOUNTER — Encounter (HOSPITAL_COMMUNITY): Payer: Self-pay | Admitting: Anesthesiology

## 2012-08-31 HISTORY — PX: ERCP: SHX5425

## 2012-08-31 LAB — GLUCOSE, CAPILLARY
Glucose-Capillary: 107 mg/dL — ABNORMAL HIGH (ref 70–99)
Glucose-Capillary: 122 mg/dL — ABNORMAL HIGH (ref 70–99)
Glucose-Capillary: 122 mg/dL — ABNORMAL HIGH (ref 70–99)
Glucose-Capillary: 88 mg/dL (ref 70–99)

## 2012-08-31 LAB — COMPREHENSIVE METABOLIC PANEL
ALT: 128 U/L — ABNORMAL HIGH (ref 0–35)
Alkaline Phosphatase: 139 U/L — ABNORMAL HIGH (ref 39–117)
BUN: 17 mg/dL (ref 6–23)
CO2: 30 mEq/L (ref 19–32)
Chloride: 102 mEq/L (ref 96–112)
GFR calc Af Amer: 71 mL/min — ABNORMAL LOW (ref >90)
GFR calc non Af Amer: 62 mL/min — ABNORMAL LOW (ref >90)
Glucose, Bld: 111 mg/dL — ABNORMAL HIGH (ref 70–99)
Potassium: 4.4 mEq/L (ref 3.5–5.1)
Sodium: 139 mEq/L (ref 135–145)
Total Bilirubin: 0.3 mg/dL (ref 0.3–1.2)
Total Protein: 5.9 g/dL — ABNORMAL LOW (ref 6.0–8.3)

## 2012-08-31 SURGERY — ERCP, WITH INTERVENTION IF INDICATED
Anesthesia: General

## 2012-08-31 MED ORDER — IPRATROPIUM BROMIDE 0.02 % IN SOLN
0.5000 mg | Freq: Four times a day (QID) | RESPIRATORY_TRACT | Status: DC | PRN
  Administered 2012-08-31: 0.5 mg via RESPIRATORY_TRACT
  Filled 2012-08-31: qty 2.5

## 2012-08-31 MED ORDER — NEOSTIGMINE METHYLSULFATE 1 MG/ML IJ SOLN
INTRAMUSCULAR | Status: DC | PRN
  Administered 2012-08-31: 1 mg via INTRAVENOUS
  Administered 2012-08-31: 4 mg via INTRAVENOUS

## 2012-08-31 MED ORDER — ONDANSETRON HCL 4 MG/2ML IJ SOLN: 4.0000 mg | Freq: Once | INTRAMUSCULAR | Status: DC | PRN

## 2012-08-31 MED ORDER — LIDOCAINE HCL (CARDIAC) 20 MG/ML IV SOLN
INTRAVENOUS | Status: DC | PRN
  Administered 2012-08-31: 80 mg via INTRAVENOUS

## 2012-08-31 MED ORDER — FENTANYL CITRATE 0.05 MG/ML IJ SOLN
INTRAMUSCULAR | Status: DC | PRN
  Administered 2012-08-31 (×2): 50 ug via INTRAVENOUS

## 2012-08-31 MED ORDER — GLYCOPYRROLATE 0.2 MG/ML IJ SOLN
INTRAMUSCULAR | Status: DC | PRN
  Administered 2012-08-31: 0.6 mg via INTRAVENOUS

## 2012-08-31 MED ORDER — FENTANYL CITRATE 0.05 MG/ML IJ SOLN: 25.0000 ug | INTRAMUSCULAR | Status: DC | PRN

## 2012-08-31 MED ORDER — SODIUM CHLORIDE 0.9 % IJ SOLN
INTRAMUSCULAR | Status: DC | PRN
  Administered 2012-08-31: 11:00:00

## 2012-08-31 MED ORDER — PROPOFOL 10 MG/ML IV BOLUS
INTRAVENOUS | Status: DC | PRN
  Administered 2012-08-31: 100 mg via INTRAVENOUS

## 2012-08-31 MED ORDER — PHENYLEPHRINE HCL 10 MG/ML IJ SOLN
INTRAMUSCULAR | Status: DC | PRN
  Administered 2012-08-31 (×2): 80 ug via INTRAVENOUS

## 2012-08-31 MED ORDER — ALBUTEROL SULFATE (5 MG/ML) 0.5% IN NEBU
2.5000 mg | INHALATION_SOLUTION | Freq: Four times a day (QID) | RESPIRATORY_TRACT | Status: DC | PRN
  Administered 2012-08-31: 2.5 mg via RESPIRATORY_TRACT
  Filled 2012-08-31: qty 0.5

## 2012-08-31 MED ORDER — ROCURONIUM BROMIDE 100 MG/10ML IV SOLN
INTRAVENOUS | Status: DC | PRN
  Administered 2012-08-31: 50 mg via INTRAVENOUS

## 2012-08-31 MED ORDER — ALBUTEROL SULFATE HFA 108 (90 BASE) MCG/ACT IN AERS
INHALATION_SPRAY | RESPIRATORY_TRACT | Status: DC | PRN
  Administered 2012-08-31 (×2): 2 via RESPIRATORY_TRACT

## 2012-08-31 MED ORDER — LACTATED RINGERS IV SOLN
INTRAVENOUS | Status: DC | PRN
  Administered 2012-08-31: 09:00:00 via INTRAVENOUS

## 2012-08-31 MED ORDER — ONDANSETRON HCL 4 MG/2ML IJ SOLN
INTRAMUSCULAR | Status: DC | PRN
  Administered 2012-08-31: 4 mg via INTRAVENOUS

## 2012-08-31 MED ORDER — ARTIFICIAL TEARS OP OINT
TOPICAL_OINTMENT | OPHTHALMIC | Status: DC | PRN
  Administered 2012-08-31: 1 via OPHTHALMIC

## 2012-08-31 NOTE — Transfer of Care (Signed)
Immediate Anesthesia Transfer of Care Note  Patient: [name]  Procedure(s) Performed: Procedure(s): ENDOSCOPIC RETROGRADE CHOLANGIOPANCREATOGRAPHY (ERCP) (N/A)  Patient Location: PACU  Anesthesia Type:General  Level of Consciousness: awake, alert  and oriented  Airway & Oxygen Therapy: Patient Spontanous Breathing and Patient connected to face mask oxygen  Post-op Assessment: Report given to PACU RN, Post -op Vital signs reviewed and stable and Patient moving all extremities  Post vital signs: Reviewed and stable  Complications: No apparent anesthesia complications

## 2012-08-31 NOTE — Anesthesia Postprocedure Evaluation (Signed)
  Anesthesia Post-op Note  Patient: [name]  Procedure(s) Performed: Procedure(s): ENDOSCOPIC RETROGRADE CHOLANGIOPANCREATOGRAPHY (ERCP) (N/A)  Patient Location: PACU  Anesthesia Type:General  Level of Consciousness: awake, alert  and oriented  Airway and Oxygen Therapy: Patient Spontanous Breathing and Patient connected to nasal cannula oxygen  Post-op Pain: none  Post-op Assessment: Post-op Vital signs reviewed  Post-op Vital Signs: Reviewed  Complications: No apparent anesthesia complications

## 2012-08-31 NOTE — Anesthesia Procedure Notes (Signed)
Procedure Name: Intubation Date/Time: 08/31/2012 10:02 AM Performed by: Luster Landsberg Pre-anesthesia Checklist: Patient identified, Emergency Drugs available, Suction available and Patient being monitored Patient Re-evaluated:Patient Re-evaluated prior to inductionOxygen Delivery Method: Circle system utilized Preoxygenation: Pre-oxygenation with 100% oxygen Intubation Type: IV induction Ventilation: Mask ventilation without difficulty and Oral airway inserted - appropriate to patient size Laryngoscope Size: Mac and 3 Grade View: Grade I Tube type: Oral Tube size: 7.5 mm Number of attempts: 1 Airway Equipment and Method: Stylet Placement Confirmation: ETT inserted through vocal cords under direct vision,  positive ETCO2 and breath sounds checked- equal and bilateral Secured at: 22 cm Tube secured with: Tape Dental Injury: Teeth and Oropharynx as per pre-operative assessment

## 2012-08-31 NOTE — Progress Notes (Signed)
[facility] PROGRESS NOTE  [name] AVW:[id number] DOB: 1929-02-26 DOA: 08/28/2012 PCP: Hoyle Sauer, MD  Subjective:  Doing well, no acute complaints, denies SOB, abdominal pain, chest pain, nausea, vomiting.  She is aware of planned procedure and implications.   Assessment/Plan Atypical chest pain  - Likely related to GERD  - Resolved with GI cocktail, no further pain today, no abdominal pain - Continue PPI at home  - Continue aspirin 81 mg daily  - Troponins neg x 2  - EKG-nonspecific ST-T wave changes  - Myoview 08/08/2011 negative, EF 68%   Transaminasemia  - no abdominal pain, no nausea/vomiting, she is NPO for ERCP  - Have d/c'd lipitor due to possible causation - Hepatic enzymes improved 08/31/12 - Hepatitis B surface antigen and hepatitis C antibody--negative  - Abdominal ultrasound 8/20 revealed choledocolithiasis, no gallbladder - consulted [facility]--> ERCP planned 08/31/12 (today)  - Recheck LFTs with AML  Right lower lobe pneumonia - Apparent on CXR 08/28/12, Levaquin started that day - WBC increased 08/29/12-->improving on 08/30/12, will check with [facility] - Received 1 dose Levaquin in the ED;  - Blood cultures x2 sets, NGTD; UC NG - levofloxacin, day 4 of 7  - Urinalysis without any significant pyuria   Hypertension  - Stable, 143/47 today - Continue home medications   Diabetes mellitus type 2  - NovoLog sliding scale  - Patient was on metformin at home, held for now - Hemoglobin A1c ordered for the morning  GERD  - Continue protonix  COPD  - Continue home O2 and long-acting beta agonist  - She is actively wheezing this morning, have added nebulizers and reminded her to ask for them if she should feel SOB  History of Atrial fibrillation  - Remains in sinus rhythm on telemetry - Continue verapamil  - continue ASA   Family Communication: daughter at beside, answered any questions.   Disposition Plan: Home when cleared by  GI   Consultants:  GI  Pharmacy  Procedures/Studies: US Abdomen Complete  08/29/2012   *RADIOLOGY REPORT*  Clinical Data:  Elevated hepatic enzymes.  Previous cholecystectomy.  COMPLETE ABDOMINAL ULTRASOUND  Comparison:  CT 11/16/2008  Findings:  Gallbladder:  Surgically absent  Common bile duct:  9.6 mm in diameter.  There is a mobile 18 mm shadowing calculus in the distal common duct.  Liver:  No focal lesion identified.  Within normal limits in parenchymal echogenicity.  IVC:  Appears normal.  Pancreas:  No focal abnormality seen.  Spleen:  7.6 cm craniocaudal length, unremarkable.  Right Kidney:  10.4 cm in length.  11 mm partially exophytic simple appearing cyst from the lower pole.  No solid renal lesion.  No hydronephrosis.  Left Kidney:   11.2 cm. No hydronephrosis.  Well-preserved cortex. Normal size and parenchymal echotexture without focal abnormalities.   Abdominal aorta:  Visualized segments unremarkable without aneurysm.  IMPRESSION: 1.  Choledocholithiasis without intrahepatic biliary ductal dilatation.   Original Report Authenticated By: D. Andria Rhein, MD   ERCP planned for today  Antibiotics:  Levaquin  08/28/12 >>>  Code Status: Full Family Communication: Pt at bedside Disposition Plan: Home when medically stable  HPI/Subjective: No events overnight.   Objective: Filed Vitals:   08/30/12 1315 08/30/12 2125 08/30/12 2220 08/31/12 0626  BP: 113/46  147/42 149/71  Pulse: 73  89 88  Temp: 98.8 F (37.1 C)  98.3 F (36.8 C) 99.4 F (37.4 C)  TempSrc:   Oral Oral  Resp: 17  18  18  Height:      Weight:      SpO2: 92% 90% 93% 94%    Intake/Output Summary (Last 24 hours) at 08/31/12 0740 Last data filed at 08/30/12 1800  Gross per 24 hour  Intake    960 ml  Output      0 ml  Net    960 ml    Exam:   General:  Pt is alert, follows commands appropriately, not in acute distress  Cardiovascular: Regular rate and rhythm, S1/S2, no murmurs  Respiratory:  Course wheezing throughout, some crackles in the bases, otherwise clear  Abdomen: Soft, non tender, non distended, bowel sounds present  Extremities: No edema, warm and well perfused  Neuro: Grossly nonfocal  Data Reviewed: Basic Metabolic Panel:  Recent Labs Lab 08/28/12 1352 08/29/12 0830 08/30/12 0400 08/31/12 0545  NA 141 138 140 139  K 4.6 4.5 4.8 4.4  CL 103 100 103 102  CO2  --  30 29 30   GLUCOSE 132* 102* 130* 111*  BUN 16 23 25* 17  CREATININE 1.00 1.07 0.96 0.86  CALCIUM  --  8.5 8.6 9.0   Liver Function Tests:  Recent Labs Lab 08/28/12 1715 08/29/12 0830 08/30/12 0400 08/31/12 0545  AST 244* 380* 118* 28  ALT 119* 367* 226* 128*  ALKPHOS 159* 201* 176* 139*  BILITOT 0.5 0.8 0.3 0.3  PROT 5.5* 5.4* 5.6* 5.9*  ALBUMIN 3.1* 2.9* 2.7* 2.7*    Recent Labs Lab 08/28/12 1715  LIPASE 24   No results found for this basename: AMMONIA,  in the last 168 hours CBC:  Recent Labs Lab 08/28/12 1340 08/28/12 1352 08/29/12 0830 08/30/12 0400  WBC 10.4  --  20.7* 12.8*  NEUTROABS 8.3*  --   --   --   HGB 10.0* 10.5* 9.8* 9.5*  HCT 32.3* 31.0* 31.4* 30.9*  MCV 94.4  --  94.3 95.4  PLT 275  --  270 229   Cardiac Enzymes:  Recent Labs Lab 08/28/12 1940 08/29/12 0240 08/29/12 0830  TROPONINI <0.30 <0.30 <0.30   BNP: No components found with this basename: POCBNP,  CBG:  Recent Labs Lab 08/29/12 1622 08/29/12 2106 08/30/12 0734 08/30/12 1126 08/30/12 2218  GLUCAP 118* 124* 114* 111* 115*    Recent Results (from the past 240 hour(s))  URINE CULTURE     Status: None   Collection Time    08/28/12  3:26 PM      Result Value Range Status   Specimen Description URINE, CLEAN CATCH   Final   Special Requests NONE   Final   Culture  Setup Time     Final   Value: 08/28/2012 15:51     Performed at [facility] Count     Final   Value: NO GROWTH     Performed at [facility]   Culture     Final   Value: NO GROWTH      Performed at [facility]   Report Status 08/29/2012 FINAL   Final  CULTURE, BLOOD (ROUTINE X 2)     Status: None   Collection Time    08/29/12 12:15 PM      Result Value Range Status   Specimen Description BLOOD LEFT ANTECUBITAL   Final   Special Requests BOTTLES DRAWN AEROBIC ONLY 10CC   Final   Culture  Setup Time     Final   Value: 08/29/2012 16:10     Performed at  [facility]     Final   Value:        BLOOD CULTURE RECEIVED NO GROWTH TO DATE CULTURE WILL BE HELD FOR 5 DAYS BEFORE ISSUING A FINAL NEGATIVE REPORT     Performed at [facility]   Report Status PENDING   Incomplete  CULTURE, BLOOD (ROUTINE X 2)     Status: None   Collection Time    08/29/12 12:30 PM      Result Value Range Status   Specimen Description BLOOD RIGHT HAND   Final   Special Requests BOTTLES DRAWN AEROBIC ONLY 5CC   Final   Culture  Setup Time     Final   Value: 08/29/2012 16:11     Performed at [facility]   Culture     Final   Value:        BLOOD CULTURE RECEIVED NO GROWTH TO DATE CULTURE WILL BE HELD FOR 5 DAYS BEFORE ISSUING A FINAL NEGATIVE REPORT     Performed at [facility]   Report Status PENDING   Incomplete  SURGICAL PCR SCREEN     Status: None   Collection Time    08/30/12  7:52 PM      Result Value Range Status   MRSA, PCR NEGATIVE  NEGATIVE Final   Staphylococcus aureus NEGATIVE  NEGATIVE Final   Comment:            The Xpert SA Assay (FDA     approved for NASAL specimens     in patients over 42 years of age),     is one component of     a comprehensive surveillance     program.  Test performance has     been validated by [employer] for patients greater     than or equal to 34 year old.     It is not intended     to diagnose infection nor to     guide or monitor treatment.     Scheduled Meds: . amitriptyline  50 mg Oral QHS  . aspirin EC  81 mg Oral Daily  . ciprofloxacin  400 mg Intravenous Once  . docusate sodium   100 mg Oral BID  . enoxaparin (LOVENOX) injection  40 mg Subcutaneous Q24H  . furosemide  20 mg Oral Daily  . insulin aspart  0-15 Units Subcutaneous TID WC  . levofloxacin  500 mg Oral Daily  . lisinopril  20 mg Oral BID  . mometasone-formoterol  2 puff Inhalation BID  . pantoprazole  40 mg Oral Daily  . polyethylene glycol  17 g Oral Daily  . sodium chloride  3 mL Intravenous Q12H  . verapamil  240 mg Oral Daily   Continuous Infusions:    Debe Coder, MD  [facility] Pager [phone]  If 7PM-7AM, please contact night-coverage [email or web address] Password [facility] 08/31/2012, 7:40 AM   LOS: 3 days

## 2012-08-31 NOTE — H&P (View-Only) (Signed)
[facility] PROGRESS NOTE  [name] MRN:[id number] DOB: 09/15/1929 DOA: 08/28/2012 PCP: AVVA,RAVISANKAR R, MD  Subjective:  Doing well, no acute complaints, denies SOB, abdominal pain, chest pain, nausea, vomiting.  She is aware of planned procedure and implications.   Assessment/Plan Atypical chest pain  - Likely related to GERD  - Resolved with GI cocktail, no further pain today, no abdominal pain - Continue PPI at home  - Continue aspirin 81 mg daily  - Troponins neg x 2  - EKG-nonspecific ST-T wave changes  - Myoview 08/08/2011 negative, EF 68%   Transaminasemia  - no abdominal pain, no nausea/vomiting, she is NPO for ERCP  - Have d/c'd lipitor due to possible causation - Hepatic enzymes improved 08/31/12 - Hepatitis B surface antigen and hepatitis C antibody--negative  - Abdominal ultrasound 8/20 revealed choledocolithiasis, no gallbladder - consulted [facility]--> ERCP planned 08/31/12 (today)  - Recheck LFTs with AML  Right lower lobe pneumonia - Apparent on CXR 08/28/12, Levaquin started that day - WBC increased 08/29/12-->improving on 08/30/12, will check with [facility] - Received 1 dose Levaquin in the ED;  - Blood cultures x2 sets, NGTD; UC NG - levofloxacin, day 4 of 7  - Urinalysis without any significant pyuria   Hypertension  - Stable, 143/47 today - Continue home medications   Diabetes mellitus type 2  - NovoLog sliding scale  - Patient was on metformin at home, held for now - Hemoglobin A1c ordered for the morning  GERD  - Continue protonix  COPD  - Continue home O2 and long-acting beta agonist  - She is actively wheezing this morning, have added nebulizers and reminded her to ask for them if she should feel SOB  History of Atrial fibrillation  - Remains in sinus rhythm on telemetry - Continue verapamil  - continue ASA   Family Communication: daughter at beside, answered any questions.   Disposition Plan: Home when cleared by  GI   Consultants:  GI  Pharmacy  Procedures/Studies: Us Abdomen Complete  08/29/2012   *RADIOLOGY REPORT*  Clinical Data:  Elevated hepatic enzymes.  Previous cholecystectomy.  COMPLETE ABDOMINAL ULTRASOUND  Comparison:  CT 11/16/2008  Findings:  Gallbladder:  Surgically absent  Common bile duct:  9.6 mm in diameter.  There is a mobile 18 mm shadowing calculus in the distal common duct.  Liver:  No focal lesion identified.  Within normal limits in parenchymal echogenicity.  IVC:  Appears normal.  Pancreas:  No focal abnormality seen.  Spleen:  7.6 cm craniocaudal length, unremarkable.  Right Kidney:  10.4 cm in length.  11 mm partially exophytic simple appearing cyst from the lower pole.  No solid renal lesion.  No hydronephrosis.  Left Kidney:   11.2 cm. No hydronephrosis.  Well-preserved cortex. Normal size and parenchymal echotexture without focal abnormalities.   Abdominal aorta:  Visualized segments unremarkable without aneurysm.  IMPRESSION: 1.  Choledocholithiasis without intrahepatic biliary ductal dilatation.   Original Report Authenticated By: D. Hassell III, MD   ERCP planned for today  Antibiotics:  Levaquin  08/28/12 >>>  Code Status: Full Family Communication: Pt at bedside Disposition Plan: Home when medically stable  HPI/Subjective: No events overnight.   Objective: Filed Vitals:   08/30/12 1315 08/30/12 2125 08/30/12 2220 08/31/12 0626  BP: 113/46  147/42 149/71  Pulse: 73  89 88  Temp: 98.8 F (37.1 C)  98.3 F (36.8 C) 99.4 F (37.4 C)  TempSrc:   Oral Oral  Resp: 17  18   18  Height:      Weight:      SpO2: 92% 90% 93% 94%    Intake/Output Summary (Last 24 hours) at 08/31/12 0740 Last data filed at 08/30/12 1800  Gross per 24 hour  Intake    960 ml  Output      0 ml  Net    960 ml    Exam:   General:  Pt is alert, follows commands appropriately, not in acute distress  Cardiovascular: Regular rate and rhythm, S1/S2, no murmurs  Respiratory:  Course wheezing throughout, some crackles in the bases, otherwise clear  Abdomen: Soft, non tender, non distended, bowel sounds present  Extremities: No edema, warm and well perfused  Neuro: Grossly nonfocal  Data Reviewed: Basic Metabolic Panel:  Recent Labs Lab 08/28/12 1352 08/29/12 0830 08/30/12 0400 08/31/12 0545  NA 141 138 140 139  K 4.6 4.5 4.8 4.4  CL 103 100 103 102  CO2  --  30 29 30  GLUCOSE 132* 102* 130* 111*  BUN 16 23 25* 17  CREATININE 1.00 1.07 0.96 0.86  CALCIUM  --  8.5 8.6 9.0   Liver Function Tests:  Recent Labs Lab 08/28/12 1715 08/29/12 0830 08/30/12 0400 08/31/12 0545  AST 244* 380* 118* 28  ALT 119* 367* 226* 128*  ALKPHOS 159* 201* 176* 139*  BILITOT 0.5 0.8 0.3 0.3  PROT 5.5* 5.4* 5.6* 5.9*  ALBUMIN 3.1* 2.9* 2.7* 2.7*    Recent Labs Lab 08/28/12 1715  LIPASE 24   No results found for this basename: AMMONIA,  in the last 168 hours CBC:  Recent Labs Lab 08/28/12 1340 08/28/12 1352 08/29/12 0830 08/30/12 0400  WBC 10.4  --  20.7* 12.8*  NEUTROABS 8.3*  --   --   --   HGB 10.0* 10.5* 9.8* 9.5*  HCT 32.3* 31.0* 31.4* 30.9*  MCV 94.4  --  94.3 95.4  PLT 275  --  270 229   Cardiac Enzymes:  Recent Labs Lab 08/28/12 1940 08/29/12 0240 08/29/12 0830  TROPONINI <0.30 <0.30 <0.30   BNP: No components found with this basename: POCBNP,  CBG:  Recent Labs Lab 08/29/12 1622 08/29/12 2106 08/30/12 0734 08/30/12 1126 08/30/12 2218  GLUCAP 118* 124* 114* 111* 115*    Recent Results (from the past 240 hour(s))  URINE CULTURE     Status: None   Collection Time    08/28/12  3:26 PM      Result Value Range Status   Specimen Description URINE, CLEAN CATCH   Final   Special Requests NONE   Final   Culture  Setup Time     Final   Value: 08/28/2012 15:51     Performed at [facility]   Colony Count     Final   Value: NO GROWTH     Performed at [facility]   Culture     Final   Value: NO GROWTH      Performed at [facility]   Report Status 08/29/2012 FINAL   Final  CULTURE, BLOOD (ROUTINE X 2)     Status: None   Collection Time    08/29/12 12:15 PM      Result Value Range Status   Specimen Description BLOOD LEFT ANTECUBITAL   Final   Special Requests BOTTLES DRAWN AEROBIC ONLY 10CC   Final   Culture  Setup Time     Final   Value: 08/29/2012 16:10     Performed at   [facility]   Culture     Final   Value:        BLOOD CULTURE RECEIVED NO GROWTH TO DATE CULTURE WILL BE HELD FOR 5 DAYS BEFORE ISSUING A FINAL NEGATIVE REPORT     Performed at [facility]   Report Status PENDING   Incomplete  CULTURE, BLOOD (ROUTINE X 2)     Status: None   Collection Time    08/29/12 12:30 PM      Result Value Range Status   Specimen Description BLOOD RIGHT HAND   Final   Special Requests BOTTLES DRAWN AEROBIC ONLY 5CC   Final   Culture  Setup Time     Final   Value: 08/29/2012 16:11     Performed at [facility]   Culture     Final   Value:        BLOOD CULTURE RECEIVED NO GROWTH TO DATE CULTURE WILL BE HELD FOR 5 DAYS BEFORE ISSUING A FINAL NEGATIVE REPORT     Performed at [facility]   Report Status PENDING   Incomplete  SURGICAL PCR SCREEN     Status: None   Collection Time    08/30/12  7:52 PM      Result Value Range Status   MRSA, PCR NEGATIVE  NEGATIVE Final   Staphylococcus aureus NEGATIVE  NEGATIVE Final   Comment:            The Xpert SA Assay (FDA     approved for NASAL specimens     in patients over 21 years of age),     is one component of     a comprehensive surveillance     program.  Test performance has     been validated by [facility] for patients greater     than or equal to 1 year old.     It is not intended     to diagnose infection nor to     guide or monitor treatment.     Scheduled Meds: . amitriptyline  50 mg Oral QHS  . aspirin EC  81 mg Oral Daily  . ciprofloxacin  400 mg Intravenous Once  . docusate sodium   100 mg Oral BID  . enoxaparin (LOVENOX) injection  40 mg Subcutaneous Q24H  . furosemide  20 mg Oral Daily  . insulin aspart  0-15 Units Subcutaneous TID WC  . levofloxacin  500 mg Oral Daily  . lisinopril  20 mg Oral BID  . mometasone-formoterol  2 puff Inhalation BID  . pantoprazole  40 mg Oral Daily  . polyethylene glycol  17 g Oral Daily  . sodium chloride  3 mL Intravenous Q12H  . verapamil  240 mg Oral Daily   Continuous Infusions:    Michaiah Maiden, MD  [facility] Pager [phone]  If 7PM-7AM, please contact night-coverage [email or web address] Password [email or web address] 08/31/2012, 7:40 AM   LOS: 3 days      

## 2012-08-31 NOTE — Op Note (Signed)
[facility] [address] [place], [address]   ERCP PROCEDURE REPORT  PATIENT: [name], [name].  MR# :[id number] BIRTHDATE: [birth year]  GENDER: Female ENDOSCOPIST: Willis Modena, MD REFERRED BY: [facility] PROCEDURE DATE:  08/31/2012 PROCEDURE:   ERCP with sphincterotomy/papillotomy and ERCP with removal of calculus/calculi ASA CLASS:    ASA-III INDICATIONS: Abdominal pain, elevated LFTs, bile duct stones seen on ultrasound MEDICATIONS:    General endotracheal anesthesia (GETA), Cipro 400 mg IV  DESCRIPTION OF PROCEDURE:   After the risks benefits and alternatives of the procedure were thoroughly explained, informed consent was obtained.  The Pentax ERCP [id number] side-viewing duodenoscope was introduced through the mouth and advanced to the second portion of the duodenum .    FINDINGS:  Ampulla reached; there were discrete biliary and pancreatic orifices and the biliary orifice was a bit patulous, all suggestive of likely prior ERCP with biliary sphincterotomy.  Deep biliary access readily achieved.  Bile duct was markedly dilated, about 15mm.  There were two mid bile duct stones, each over 10mm in diameter.  Post-cholecystectomy anatomy appreciated.  Patient's (presumed) biliary sphincterotomy was extended with blended current without immediate complication.  A trapezoidal basket was used to remove both stones without difficulty.  Completion occlusion cholangiogram showed no obvious residual bile duct filling defects. Pancreatogram was not obtained, intentionally.  ENDOSCOPIC IMPRESSION:  Two large bile duct stones, removed as above.  RECOMMENDATIONS: 1.  Watch for potential complications of procedure. 2.  No ASA/NSAIDs for seven days post-papillotomy. 3.  Clear liquid diet, if patient is feeling ok after she wakes up. 4.  [facility] to follow.     _______________________________ Rosalie DoctorWillis Modena, MD 08/31/2012 10:54  AM   CC:

## 2012-08-31 NOTE — Progress Notes (Signed)
Patient resting prone with extremity pads,face and head cradle in place,chest rolls in place.

## 2012-08-31 NOTE — Anesthesia Preprocedure Evaluation (Signed)
Anesthesia Evaluation  Patient identified by MRN, date of birth, ID band Patient awake    Reviewed: Allergy & Precautions, H&P , NPO status , Patient's Chart, lab work & pertinent test results  Airway Mallampati: I TM Distance: >3 FB Neck ROM: Full    Dental  (+) Edentulous Upper, Edentulous Lower and Dental Advisory Given   Pulmonary asthma , COPD oxygen dependent,  breath sounds clear to auscultation        Cardiovascular hypertension, Pt. on medications Rhythm:Regular Rate:Normal     Neuro/Psych    GI/Hepatic   Endo/Other  Morbid obesity  Renal/GU      Musculoskeletal   Abdominal   Peds  Hematology   Anesthesia Other Findings   Reproductive/Obstetrics                           Anesthesia Physical Anesthesia Plan  ASA: III  Anesthesia Plan: General   Post-op Pain Management:    Induction: Intravenous  Airway Management Planned: Oral ETT  Additional Equipment:   Intra-op Plan:   Post-operative Plan: Extubation in OR  Informed Consent: I have reviewed the patients History and Physical, chart, labs and discussed the procedure including the risks, benefits and alternatives for the proposed anesthesia with the patient or authorized representative who has indicated his/her understanding and acceptance.   Dental advisory given  Plan Discussed with: CRNA, Anesthesiologist and Surgeon  Anesthesia Plan Comments:         Anesthesia Quick Evaluation

## 2012-08-31 NOTE — Interval H&P Note (Signed)
History and Physical Interval Note:  08/31/2012 9:52 AM  [name]  has presented today for surgery, with the diagnosis of STONES  The various methods of treatment have been discussed with the patient and family. After consideration of risks, benefits and other options for treatment, the patient has consented to  Procedure(s): ENDOSCOPIC RETROGRADE CHOLANGIOPANCREATOGRAPHY (ERCP) (N/A) as a surgical intervention .  The patient's history has been reviewed, patient examined, no change in status, stable for surgery.  I have reviewed the patient's chart and labs.  Questions were answered to the patient's satisfaction.     Vikas Wegmann M  Assessment:  1.  Abdominal pain, intermittent. 2.  Elevated LFTs. 3.  Bile duct stone on ultrasound; post-cholecystectomy.  Plan:  1.  Endoscopic retrograde cholangiopancreatography with possible biliary sphincterotomy and bile duct stone extraction. 2.  Risks (up to and including bleeding, infection, perforation, pancreatitis that can be complicated by infected necrosis and death), benefits (removal of stones, alleviating blockage, decreasing risk of cholangitis or choledocholithiasis-related pancreatitis), and alternatives (watchful waiting, percutaneous transhepatic cholangiography) of ERCP were explained to patient  in detail and patient elects to proceed.

## 2012-08-31 NOTE — Anesthesia Postprocedure Evaluation (Signed)
  Anesthesia Post-op Note  Patient: [name]  Procedure(s) Performed: Procedure(s): ENDOSCOPIC RETROGRADE CHOLANGIOPANCREATOGRAPHY (ERCP) (N/A)  Patient Location: PACU  Anesthesia Type:General  Level of Consciousness: awake and alert   Airway and Oxygen Therapy: Patient Spontanous Breathing and Patient connected to nasal cannula oxygen  Post-op Pain: none  Post-op Assessment: Post-op Vital signs reviewed, Patient's Cardiovascular Status Stable, Respiratory Function Stable, Patent Airway and No signs of Nausea or vomiting  Post-op Vital Signs: Reviewed and stable  Complications: No apparent anesthesia complications

## 2012-09-01 LAB — COMPREHENSIVE METABOLIC PANEL
Albumin: 2.6 g/dL — ABNORMAL LOW (ref 3.5–5.2)
BUN: 15 mg/dL (ref 6–23)
Creatinine, Ser: 0.85 mg/dL (ref 0.50–1.10)
Total Protein: 5.7 g/dL — ABNORMAL LOW (ref 6.0–8.3)

## 2012-09-01 LAB — CBC
HCT: 30.5 % — ABNORMAL LOW (ref 36.0–46.0)
MCV: 94.4 fL (ref 78.0–100.0)
Platelets: 232 10*3/uL (ref 150–400)
RBC: 3.23 MIL/uL — ABNORMAL LOW (ref 3.87–5.11)
WBC: 7 10*3/uL (ref 4.0–10.5)

## 2012-09-01 LAB — GLUCOSE, CAPILLARY

## 2012-09-01 MED ORDER — ONDANSETRON HCL 4 MG PO TABS: 4.0000 mg | ORAL_TABLET | Freq: Three times a day (TID) | ORAL | Status: DC | PRN

## 2012-09-01 MED ORDER — LEVOFLOXACIN 500 MG PO TABS: 500.0000 mg | ORAL_TABLET | Freq: Every day | ORAL | Status: DC

## 2012-09-01 MED ORDER — FLUCONAZOLE 100 MG PO TABS: 150.0000 mg | ORAL_TABLET | Freq: Every day | ORAL | Status: DC

## 2012-09-01 NOTE — Progress Notes (Signed)
Utilization review completed.  

## 2012-09-01 NOTE — Discharge Summary (Signed)
Physician Discharge Summary  [name] NWG:[id number] DOB: [birth year] DOA: 08/28/2012  PCP: Sheri Sauer, MD  Admit date: 08/28/2012 Discharge date: 09/03/2012  Recommendations for Outpatient Follow-up:  1. Pt will need to follow up with PCP in 2-3 weeks post discharge 2. Please obtain CMP to evaluate electrolytes and kidney function 3. Please also check CBC to evaluate Hg and Hct levels 4. Please evaluate for resolution of nausea and ability to take PO after ERCP.   Discharge Diagnoses:  Principal Problem:   Choledocholithiasis Active Problems:   Atypical chest pain   HTN (hypertension)   COPD (chronic obstructive pulmonary disease)   Atrial fibrillation   GERD (gastroesophageal reflux disease)   Bronchitis, acute   Transaminasemia    Discharge Condition: Stable  Diet recommendation: Heart healthy diet discussed in details   History of present illness from admission:  79 year old female with history of hypertension, COPD on home O2, paroxysmal A. fib not on Coumadin due to history of GI bleed, chronic anemia, atypical chest pains, presented to the ED with acute onset of chest pain. Patient was walking around in the house when she had a sharp stabbing pain mainly over the epigastric area which was radiating to her shoulder blade, 10/10 in intensity aggravated by sitting up and bending her knees towards the abdomen or coughing. Has associated nausea but no vomiting. Denies acid reflux. She denies associated chest pain, palpitations, headache, blurred vision, bowel or urinary symptoms. She denies worsening shortness of breath fever or cough. She reports having similar symptoms about 6 weeks back which subsided on its warm.  Patient came to the ED and she was given sublingual nitrate and 2 mg of IV morphine after which her symptoms improve to 8/10.  Patient follows with Dr. Patty Barnett for her paroxysmal A. fib. She has had a cardiac cath in 2010 showing minimal disease.  Vitals  were unremarkable. Labs were normal. She had a troponin done in the ED which was negative. EKG showed normal sinus rhythm at 72 without ST-T changes. [facility] called for admission under observation.   Hospital Course:  Principal Problem:   Choledocholithiasis Active Problems:   Atypical chest pain   HTN (hypertension)   COPD (chronic obstructive pulmonary disease)   Atrial fibrillation   GERD (gastroesophageal reflux disease)   Bronchitis, acute   Transaminasemia  [name] was admitted for epigastric pain radiating to the shoulder. Abdominal ultrasound revealed choledocolithiasis and she was also found to have transaminitis.  She was evaluated by GI who planned to do an ERCP.  This procedure was done on 8/22 and 2 large bile duct stones were removed.  On discharge her LFTs were trending down and she was tolerating a PO diet with only some nausea.  She has baseline motion sickness, so some nausea persisted.  She was sent home on a PPI.  She was also found, on CXR, to have a RLL infitrate which was treated as a pneumonia with Levaquin.  She was sent home with family.   Procedures/Studies: US Abdomen Complete  08/29/2012   *RADIOLOGY REPORT*  Clinical Data:  Elevated hepatic enzymes.  Previous cholecystectomy.  COMPLETE ABDOMINAL ULTRASOUND  Comparison:  CT 11/16/2008  Findings:  Gallbladder:  Surgically absent  Common bile duct:  9.6 mm in diameter.  There is a mobile 18 mm shadowing calculus in the distal common duct.  Liver:  No focal lesion identified.  Within normal limits in parenchymal echogenicity.  IVC:  Appears normal.  Pancreas:  No focal  abnormality seen.  Spleen:  7.6 cm craniocaudal length, unremarkable.  Right Kidney:  10.4 cm in length.  11 mm partially exophytic simple appearing cyst from the lower pole.  No solid renal lesion.  No hydronephrosis.  Left Kidney:   11.2 cm. No hydronephrosis.  Well-preserved cortex. Normal size and parenchymal echotexture without focal  abnormalities.   Abdominal aorta:  Visualized segments unremarkable without aneurysm.  IMPRESSION: 1.  Choledocholithiasis without intrahepatic biliary ductal dilatation.   Original Report Authenticated By: D. Andria Rhein, MD   Dg Chest Port 1 View  08/28/2012   *RADIOLOGY REPORT*  Clinical Data: Chest pain.  PORTABLE CHEST - 1 VIEW  Comparison: 02/16/2011  Findings: New mild opacity seen in the right lower lung, suspicious for developing infiltrate versus atelectasis.  Mild atelectasis or scarring left lung base is stable.  Heart size is within normal limits.  IMPRESSION: New mild right lower lung infiltrate versus atelectasis.  Continued radiographic followup recommended.   Original Report Authenticated By: [name], M.D.   Dg Ercp  08/31/2012   *RADIOLOGY REPORT*  Clinical Data: Choledocholithiasis.  ERCP  Technique:  C-arm fluoroscopic images were obtained intraoperatively and submitted for postoperative interpretation. Please see the performing provider's procedural report for the fluoroscopy time utilized.  Comparison: Ultrasound 08/29/2012  Findings: Cannulization and opacification of the common bile duct. Common bile duct is markedly dilated.  There are one or two large filling defects in the common bile duct that are consistent with stones.  A balloon was used for stone removal.  IMPRESSION: Choledocholithiasis and stone removal.   Original Report Authenticated By: [name], M.D.       Consultations:  Gastroenterology  Antibiotics:  Levaquin, started 8/19 for 7 days.   Discharge Exam: Filed Vitals:   09/01/12 1006  BP: 130/70  Pulse:   Temp:   Resp:    Filed Vitals:   08/31/12 2100 09/01/12 0500 09/01/12 0915 09/01/12 1006  BP: 105/50 132/34  130/70  Pulse: 75 80    Temp: 98.5 F (36.9 C) 98.7 F (37.1 C)    TempSrc:      Resp: 20 18    Height:      Weight:      SpO2: 94% 94% 95%     General: Pt is alert, follows commands appropriately, not in acute  distress Cardiovascular: Regular rate and rhythm, S1/S2 + Respiratory: Clear to auscultation bilaterally, no wheezing Abdominal: Soft, non tender, non distended, bowel sounds + Extremities: no edema, warm and well perfused Neuro: Grossly nonfocal  Discharge Instructions     Medication List    STOP taking these medications       atorvastatin 20 MG tablet  Commonly known as:  LIPITOR      TAKE these medications       albuterol 108 (90 BASE) MCG/ACT inhaler  Commonly known as:  PROVENTIL HFA;VENTOLIN HFA  Inhale 2 puffs into the lungs every 6 (six) hours as needed. For shortness of breath.     amitriptyline 50 MG tablet  Commonly known as:  ELAVIL  Take 50 mg by mouth at bedtime.     aspirin 81 MG tablet  Take 81 mg by mouth daily.     betamethasone valerate 0.1 % cream  Commonly known as:  VALISONE  Apply topically 2 (two) times daily.     fluconazole 100 MG tablet  Commonly known as:  DIFLUCAN  Take 1.5 tablets (150 mg total) by mouth daily.     fluticasone-salmeterol 230-21  MCG/ACT inhaler  Commonly known as:  ADVAIR HFA  Inhale 1 puff into the lungs 2 (two) times daily.     furosemide 20 MG tablet  Commonly known as:  LASIX  Take 20 mg by mouth daily.     levofloxacin 500 MG tablet  Commonly known as:  LEVAQUIN  Take 1 tablet (500 mg total) by mouth daily.     lisinopril 20 MG tablet  Commonly known as:  PRINIVIL,ZESTRIL  Take 1 tablet (20 mg total) by mouth 2 (two) times daily.     metFORMIN 500 MG tablet  Commonly known as:  GLUCOPHAGE  Take 500 mg by mouth 2 (two) times daily with a meal.     nitroGLYCERIN 0.4 MG SL tablet  Commonly known as:  NITROSTAT  Place 0.8 mg under the tongue every 5 (five) minutes as needed. For chest pain.     omeprazole 20 MG capsule  Commonly known as:  PRILOSEC  Take 20 mg by mouth daily.     ondansetron 4 MG tablet  Commonly known as:  ZOFRAN  Take 1 tablet (4 mg total) by mouth every 8 (eight) hours as needed  for nausea.     verapamil 240 MG CR tablet  Commonly known as:  CALAN-SR  Take 1 tablet (240 mg total) by mouth daily.       Follow-up Information   Follow up with Sheri Sauer, MD. Call on 09/03/2012.   Specialty:  Internal Medicine   Contact information:   [address] [facility] [place] [address] [phone]        The results of significant diagnostics from this hospitalization (including imaging, microbiology, ancillary and laboratory) are listed below for reference.     Microbiology: Recent Results (from the past 240 hour(s))  URINE CULTURE     Status: None   Collection Time    08/28/12  3:26 PM      Result Value Range Status   Specimen Description URINE, CLEAN CATCH   Final   Special Requests NONE   Final   Culture  Setup Time     Final   Value: 08/28/2012 15:51     Performed at [facility] Count     Final   Value: NO GROWTH     Performed at [facility]   Culture     Final   Value: NO GROWTH     Performed at [facility]   Report Status 08/29/2012 FINAL   Final  CULTURE, BLOOD (ROUTINE X 2)     Status: None   Collection Time    08/29/12 12:15 PM      Result Value Range Status   Specimen Description BLOOD LEFT ANTECUBITAL   Final   Special Requests BOTTLES DRAWN AEROBIC ONLY 10CC   Final   Culture  Setup Time     Final   Value: 08/29/2012 16:10     Performed at [facility]   Culture     Final   Value:        BLOOD CULTURE RECEIVED NO GROWTH TO DATE CULTURE WILL BE HELD FOR 5 DAYS BEFORE ISSUING A FINAL NEGATIVE REPORT     Performed at [facility]   Report Status PENDING   Incomplete  CULTURE, BLOOD (ROUTINE X 2)     Status: None   Collection Time    08/29/12 12:30 PM      Result Value Range Status  Specimen Description BLOOD RIGHT HAND   Final   Special Requests BOTTLES DRAWN AEROBIC ONLY 5CC   Final   Culture  Setup Time     Final   Value: 08/29/2012 16:11      Performed at [facility]   Culture     Final   Value:        BLOOD CULTURE RECEIVED NO GROWTH TO DATE CULTURE WILL BE HELD FOR 5 DAYS BEFORE ISSUING A FINAL NEGATIVE REPORT     Performed at [facility]   Report Status PENDING   Incomplete  SURGICAL PCR SCREEN     Status: None   Collection Time    08/30/12  7:52 PM      Result Value Range Status   MRSA, PCR NEGATIVE  NEGATIVE Final   Staphylococcus aureus NEGATIVE  NEGATIVE Final   Comment:            The Xpert SA Assay (FDA     approved for NASAL specimens     in patients over 46 years of age),     is one component of     a comprehensive surveillance     program.  Test performance has     been validated by [employer] for patients greater     than or equal to 26 year old.     It is not intended     to diagnose infection nor to     guide or monitor treatment.     Labs: Basic Metabolic Panel:  Recent Labs Lab 08/28/12 1352 08/29/12 0830 08/30/12 0400 08/31/12 0545 09/01/12 0410  NA 141 138 140 139 140  K 4.6 4.5 4.8 4.4 4.5  CL 103 100 103 102 103  CO2  --  30 29 30 31   GLUCOSE 132* 102* 130* 111* 119*  BUN 16 23 25* 17 15  CREATININE 1.00 1.07 0.96 0.86 0.85  CALCIUM  --  8.5 8.6 9.0 8.6   Liver Function Tests:  Recent Labs Lab 08/28/12 1715 08/29/12 0830 08/30/12 0400 08/31/12 0545 09/01/12 0410  AST 244* 380* 118* 28 14  ALT 119* 367* 226* 128* 82*  ALKPHOS 159* 201* 176* 139* 120*  BILITOT 0.5 0.8 0.3 0.3 0.3  PROT 5.5* 5.4* 5.6* 5.9* 5.7*  ALBUMIN 3.1* 2.9* 2.7* 2.7* 2.6*    Recent Labs Lab 08/28/12 1715  LIPASE 24   No results found for this basename: AMMONIA,  in the last 168 hours CBC:  Recent Labs Lab 08/28/12 1340 08/28/12 1352 08/29/12 0830 08/30/12 0400 09/01/12 0410  WBC 10.4  --  20.7* 12.8* 7.0  NEUTROABS 8.3*  --   --   --   --   HGB 10.0* 10.5* 9.8* 9.5* 9.3*  HCT 32.3* 31.0* 31.4* 30.9* 30.5*  MCV 94.4  --  94.3 95.4 94.4  PLT 275  --  270 229 232    Cardiac Enzymes:  Recent Labs Lab 08/28/12 1940 08/29/12 0240 08/29/12 0830  TROPONINI <0.30 <0.30 <0.30   BNP: BNP (last 3 results) No results found for this basename: PROBNP,  in the last 8760 hours CBG:  Recent Labs Lab 08/31/12 1114 08/31/12 1640 08/31/12 2025 09/01/12 0727 09/01/12 1127  GLUCAP 122* 122* 88 104* 104*     SIGNED: Time coordinating discharge: Over 30 minutes  Sheri Coder, MD  [facility] 09/03/2012, 9:34 AM Pager [phone]  If 7PM-7AM, please contact night-coverage [email or web address] Password [email or web address]

## 2012-09-01 NOTE — Progress Notes (Signed)
Patient ID: [name], female   DOB: 09-Feb-1929, 79 y.o.   MRN: [id number] [facility] Gastroenterology Progress Note  JAHARI WIGINTON 79 y.o. May 27, 1929   Subjective: Tolerating liquids. Wants to go home. Denies abdominal pain.   Objective: Vital signs in last 24 hours: Filed Vitals:   09/01/12 1006  BP: 130/70  Pulse: 80  Temp: 98.7  Resp: 18    Physical Exam: Gen: alert, no acute distress, morbidly obese Abd: soft, minimal tenderness with voluntary guarding, nondistended, +BS  Lab Results:  Recent Labs  08/31/12 0545 09/01/12 0410  NA 139 140  K 4.4 4.5  CL 102 103  CO2 30 31  GLUCOSE 111* 119*  BUN 17 15  CREATININE 0.86 0.85  CALCIUM 9.0 8.6    Recent Labs  08/31/12 0545 09/01/12 0410  AST 28 14  ALT 128* 82*  ALKPHOS 139* 120*  BILITOT 0.3 0.3  PROT 5.9* 5.7*  ALBUMIN 2.7* 2.6*    Recent Labs  08/30/12 0400 09/01/12 0410  WBC 12.8* 7.0  HGB 9.5* 9.3*  HCT 30.9* 30.5*  MCV 95.4 94.4  PLT 229 232   No results found for this basename: LABPROT, INR,  in the last 72 hours    Assessment/Plan: S/P ERCP with 2 common bile duct stones removed in pt s/p cholecystectomy. Advance diet and if tolerates diet then ok to D/C home today.   Burkley Dech C. 09/01/2012, 12:00 PM

## 2012-09-03 ENCOUNTER — Encounter (HOSPITAL_COMMUNITY): Payer: Self-pay | Admitting: Gastroenterology

## 2012-09-03 LAB — GLUCOSE, CAPILLARY: Glucose-Capillary: 126 mg/dL — ABNORMAL HIGH (ref 70–99)

## 2012-09-04 LAB — CULTURE, BLOOD (ROUTINE X 2): Culture: NO GROWTH

## 2012-09-10 ENCOUNTER — Emergency Department (HOSPITAL_COMMUNITY)
Admission: EM | Admit: 2012-09-10 | Discharge: 2012-09-10 | Disposition: A | Discharge status: Home / Self Care | Payer: Medicare Other | Attending: Emergency Medicine | Admitting: Emergency Medicine

## 2012-09-10 ENCOUNTER — Encounter (HOSPITAL_COMMUNITY): Payer: Self-pay | Admitting: *Deleted

## 2012-09-10 DIAGNOSIS — Z8719 Personal history of other diseases of the digestive system: Secondary | ICD-10-CM | POA: Insufficient documentation

## 2012-09-10 DIAGNOSIS — Z792 Long term (current) use of antibiotics: Secondary | ICD-10-CM | POA: Insufficient documentation

## 2012-09-10 DIAGNOSIS — Z79899 Other long term (current) drug therapy: Secondary | ICD-10-CM | POA: Insufficient documentation

## 2012-09-10 DIAGNOSIS — R220 Localized swelling, mass and lump, head: Secondary | ICD-10-CM | POA: Insufficient documentation

## 2012-09-10 DIAGNOSIS — Z8679 Personal history of other diseases of the circulatory system: Secondary | ICD-10-CM | POA: Insufficient documentation

## 2012-09-10 DIAGNOSIS — Z7982 Long term (current) use of aspirin: Secondary | ICD-10-CM | POA: Insufficient documentation

## 2012-09-10 DIAGNOSIS — Z87891 Personal history of nicotine dependence: Secondary | ICD-10-CM | POA: Insufficient documentation

## 2012-09-10 DIAGNOSIS — I10 Essential (primary) hypertension: Secondary | ICD-10-CM | POA: Insufficient documentation

## 2012-09-10 DIAGNOSIS — J441 Chronic obstructive pulmonary disease with (acute) exacerbation: Secondary | ICD-10-CM | POA: Insufficient documentation

## 2012-09-10 MED ORDER — FAMOTIDINE 20 MG PO TABS
20.0000 mg | ORAL_TABLET | Freq: Once | ORAL | Status: AC
  Administered 2012-09-10: 20 mg via ORAL
  Filled 2012-09-10: qty 1

## 2012-09-10 NOTE — ED Provider Notes (Addendum)
CSN: [id number]     Arrival date & time 09/10/12  0755 History   First MD Initiated Contact with Patient 09/10/12 [phone]     Chief Complaint  Patient presents with  . Allergic Reaction   (Consider location/radiation/quality/duration/timing/severity/associated sxs/prior Treatment) HPI Patient presents with tongue swelling.  She woke approximately 2 hours prior to my evaluation with notable swelling of her tongue throughout.  She went to bed last night in her usual state of health. No new medication. She does have history of recent surgery, but states that her recovery over the past week has been essentially unremarkable. Since onset this morning, the swelling was significant, but improved somewhat with Benadryl.  There was no concurrent dyspnea, fever, chills, chest pain, other notable changes. On my exam the patient states that her tongue has decreased in size substantially, remains slightly large. There is some concern on her part of possible biting, incidentally, of the tongue.  Past Medical History  Diagnosis Date  . Hypertension   . COPD (chronic obstructive pulmonary disease)   . Aortic insufficiency     Mild to moderate per echo in Jan 2012  . Dizziness   . PAF (paroxysmal atrial fibrillation)   . History of GI bleed   . Anemia   . DOE (dyspnea on exertion)   . Chest pain     Minimal disease per cath in 2010  . Mild aortic stenosis     Per echo in Jan 2012  . Asthma    Past Surgical History  Procedure Laterality Date  . Cardiac catheterization  03/10/2008    EF 65%; Minimal luminal irregularities with no obstructive lesions  . Cholecystectomy    . Hernia repair    . Transthoracic echocardiogram  02/05/2010    EF 55-65%; Mild AS, mild to moderate AI  . Cardiovascular stress test  02/29/2008    EF 60%  . Ercp N/A 08/31/2012    Procedure: ENDOSCOPIC RETROGRADE CHOLANGIOPANCREATOGRAPHY (ERCP);  Surgeon: Willis Modena, MD;  Location: [facility] OR;  Service: Endoscopy;  Laterality: N/A;    Family History  Problem Relation Age of Onset  . Heart attack Father    History  Substance Use Topics  . Smoking status: Former Smoker    Quit date: 01/11/2008  . Smokeless tobacco: Not on file  . Alcohol Use: No   OB History   Grav Para Term Preterm Abortions TAB SAB Ect Mult Living                 Review of Systems  All other systems reviewed and are negative.    Allergies  Amoxicillin  Home Medications   Current Outpatient Rx  Name  Route  Sig  Dispense  Refill  . albuterol (PROVENTIL HFA;VENTOLIN HFA) 108 (90 BASE) MCG/ACT inhaler   Inhalation   Inhale 2 puffs into the lungs every 6 (six) hours as needed. For shortness of breath.         Marland Kitchen amitriptyline (ELAVIL) 50 MG tablet   Oral   Take 50 mg by mouth at bedtime.           Marland Kitchen aspirin 81 MG tablet   Oral   Take 81 mg by mouth daily.          . betamethasone valerate (VALISONE) 0.1 % cream   Topical   Apply topically 2 (two) times daily.         . fluconazole (DIFLUCAN) 100 MG tablet   Oral   Take 1.5 tablets (150  mg total) by mouth daily.   2 tablet   0   . fluticasone-salmeterol (ADVAIR HFA) 230-21 MCG/ACT inhaler   Inhalation   Inhale 1 puff into the lungs 2 (two) times daily.         . furosemide (LASIX) 20 MG tablet   Oral   Take 20 mg by mouth daily.         Marland Kitchen levofloxacin (LEVAQUIN) 500 MG tablet   Oral   Take 1 tablet (500 mg total) by mouth daily.   2 tablet   0   . lisinopril (PRINIVIL,ZESTRIL) 20 MG tablet   Oral   Take 1 tablet (20 mg total) by mouth 2 (two) times daily.   60 tablet   3   . metFORMIN (GLUCOPHAGE) 500 MG tablet   Oral   Take 500 mg by mouth 2 (two) times daily with a meal.         . nitroGLYCERIN (NITROSTAT) 0.4 MG SL tablet   Sublingual   Place 0.8 mg under the tongue every 5 (five) minutes as needed. For chest pain.         Marland Kitchen omeprazole (PRILOSEC) 20 MG capsule   Oral   Take 20 mg by mouth daily.          . ondansetron (ZOFRAN) 4  MG tablet   Oral   Take 1 tablet (4 mg total) by mouth every 8 (eight) hours as needed for nausea.   10 tablet   0   . verapamil (CALAN-SR) 240 MG CR tablet   Oral   Take 1 tablet (240 mg total) by mouth daily.   30 tablet   5     THIS IS A NEW DOSE    BP 132/36  Pulse 77  Temp(Src) 98.3 F (36.8 C) (Oral)  Resp 18  Ht 5\' 4"  (1.626 m)  Wt 184 lb (83.462 kg)  BMI 31.57 kg/m2  SpO2 95% Physical Exam  Nursing note and vitals reviewed. Constitutional: She is oriented to person, place, and time. She appears well-developed and well-nourished. No distress.  HENT:  Head: Normocephalic and atraumatic.  Mouth/Throat: Uvula is midline and oropharynx is clear and moist.    Eyes: Conjunctivae and EOM are normal.  Cardiovascular: Normal rate and regular rhythm.   Pulmonary/Chest: Effort normal and breath sounds normal. No stridor. No respiratory distress.  Abdominal: She exhibits no distension.  Musculoskeletal: She exhibits no edema.  Neurological: She is alert and oriented to person, place, and time. No cranial nerve deficit.  Skin: Skin is warm and dry.  Psychiatric: She has a normal mood and affect.    ED Course  Procedures (including critical care time) Labs Review Labs Reviewed - No data to display Imaging Review No results found. Pulse oximetry 97% on room air this is adequate  MDM  No diagnosis found. This patient presents with concerns of tongue enlargement.  On exam the patient has a visibly enlarged tongue, with abrasion on the right side.  Though this presentation may be secondary to nocturnal incidental tongue biting, there is some suspicion of lisinopril related angioedema. Patient has been taking this medication for some time.  During the conversation the patient recalls that she has had episodes of facial edema, angioedema in the past with different medication.  With this acknowledged susceptibility to allergic reaction, the patient will be treated for presumed  allergic reaction, although the abrasion may signify biting, as the causative etiology. I had a lengthy discussion with the patient  and her daughter about these possibilities.  The patient's emergency room stay she remained in no distress, with no increase in size of her tongue and she received initiation of Pepcid therapy, was discharged in stable condition to follow up with her physician in the next few days for reconsideration of additional antihypertensives as needed.    Gerhard Munch, MD 09/10/12 1610  Gerhard Munch, MD 09/10/12 [phone]

## 2012-09-10 NOTE — ED Notes (Signed)
PT reports when she woke up at 0630 the RT side on tongue was swollen. Pt took 50mg  Benadryl before EMS arrived. Pt now reports swelling has decreased.

## 2012-12-04 ENCOUNTER — Ambulatory Visit: Payer: Medicare Other | Admitting: Cardiology

## 2013-01-22 ENCOUNTER — Encounter: Payer: Self-pay | Admitting: Cardiology

## 2013-01-22 ENCOUNTER — Ambulatory Visit (INDEPENDENT_AMBULATORY_CARE_PROVIDER_SITE_OTHER): Payer: Medicare Other | Admitting: Cardiology

## 2013-01-22 ENCOUNTER — Encounter (INDEPENDENT_AMBULATORY_CARE_PROVIDER_SITE_OTHER): Payer: Self-pay

## 2013-01-22 VITALS — BP 172/68 | HR 90 | Ht 64.5 in | Wt 190.0 lb

## 2013-01-22 DIAGNOSIS — I10 Essential (primary) hypertension: Secondary | ICD-10-CM

## 2013-01-22 DIAGNOSIS — I480 Paroxysmal atrial fibrillation: Secondary | ICD-10-CM

## 2013-01-22 DIAGNOSIS — Z8719 Personal history of other diseases of the digestive system: Secondary | ICD-10-CM

## 2013-01-22 DIAGNOSIS — I119 Hypertensive heart disease without heart failure: Secondary | ICD-10-CM

## 2013-01-22 DIAGNOSIS — I4891 Unspecified atrial fibrillation: Secondary | ICD-10-CM

## 2013-01-22 DIAGNOSIS — R0789 Other chest pain: Secondary | ICD-10-CM

## 2013-01-22 NOTE — Assessment & Plan Note (Signed)
The patient has not had any recurrent evidence of GI bleeding

## 2013-01-22 NOTE — Assessment & Plan Note (Signed)
The patient has not been having a dizzy spells or syncope.  No symptoms of CHF.

## 2013-01-22 NOTE — Patient Instructions (Signed)
Your physician recommends that you continue on your current medications as directed. Please refer to the Current Medication list given to you today.  Your physician wants you to follow-up in: 6 MONTH OV /EKG You will receive a reminder letter in the mail two months in advance. If you don't receive a letter, please call our office to schedule the follow-up appointment.  

## 2013-01-22 NOTE — Progress Notes (Signed)
Sheri KeelAnn R Fussner Date of Birth:  [birth year] [address] [place], [place]  [id number] [phone]         Fax   [phone]  History of Present Illness: This pleasant 80 year old woman is seen for a scheduled followup visit. She has a past history of paroxysmal atrial fibrillation and a past history of essential hypertension. She also has a previous history of GI bleeding. She is not on Coumadin because of her GI bleeding history. Of note is the fact that she [place] the beach in [place] 2013 and had another GI bleed. [place] and has had subsequent followup with Dr. Randa EvensEdwards. The patient has had no further recognized GI bleeding since then. Her overall energy level has improved. He has increased her activity level. She walks her dogs several times a day. Since last visit she's had no new cardiac symptoms.  She still has a cough.  She lives with smokers.  She herself has been a nonsmoker for 5 years.  In August 2014 she had to undergo ERCP for retained common duct stones.  Dr. Dulce Sellaroutlaw is her gastroenterologist.  Recently she also thinks she passed a kidney stone and had some subsequent hematuria.  I recommended that she have a urologist check her.   Current Outpatient Prescriptions  Medication Sig Dispense Refill  . albuterol (PROVENTIL HFA;VENTOLIN HFA) 108 (90 BASE) MCG/ACT inhaler Inhale 2 puffs into the lungs every 6 (six) hours as needed. For shortness of breath.      Marland Kitchen. amitriptyline (ELAVIL) 50 MG tablet Take 50 mg by mouth at bedtime.        Marland Kitchen. aspirin 81 MG tablet Take 81 mg by mouth daily.       Marland Kitchen. atorvastatin (LIPITOR) 20 MG tablet Take 20 mg by mouth daily at 6 PM.       . betamethasone valerate (VALISONE) 0.1 % cream Apply topically 2 (two) times daily.      . fluconazole (DIFLUCAN) 100 MG tablet Take 1.5 tablets (150 mg total) by mouth daily.  2 tablet  0  . fluticasone-salmeterol (ADVAIR HFA) 230-21 MCG/ACT inhaler Inhale  1 puff into the lungs 2 (two) times daily.      . furosemide (LASIX) 20 MG tablet Take 20 mg by mouth daily.      Marland Kitchen. levofloxacin (LEVAQUIN) 500 MG tablet Take 1 tablet (500 mg total) by mouth daily.  2 tablet  0  . losartan (COZAAR) 25 MG tablet Take 25 mg by mouth daily.       . metFORMIN (GLUCOPHAGE) 500 MG tablet Take 500 mg by mouth 2 (two) times daily with a meal.      . nitrofurantoin, macrocrystal-monohydrate, (MACROBID) 100 MG capsule       . nitroGLYCERIN (NITROSTAT) 0.4 MG SL tablet Place 0.8 mg under the tongue every 5 (five) minutes as needed. For chest pain.      Marland Kitchen. omeprazole (PRILOSEC) 20 MG capsule Take 20 mg by mouth daily.       . ondansetron (ZOFRAN) 4 MG tablet Take 1 tablet (4 mg total) by mouth every 8 (eight) hours as needed for nausea.  10 tablet  0  . verapamil (CALAN-SR) 240 MG CR tablet Take 1 tablet (240 mg total) by mouth daily.  30 tablet  5   No current facility-administered medications for this visit.    Allergies  Allergen Reactions  . Amoxicillin Diarrhea  . Lisinopril Swelling  Swelling of tongue    Patient Active Problem List   Diagnosis Date Noted  . Choledocholithiasis 08/30/2012  . Transaminasemia 08/29/2012  . GERD (gastroesophageal reflux disease) 08/28/2012  . Bronchitis, acute 08/28/2012  . Atrial fibrillation 02/22/2011  . Atypical chest pain 09/03/2010  . HTN (hypertension) 09/03/2010  . COPD (chronic obstructive pulmonary disease) 09/03/2010  . Valvular heart disease 09/03/2010  . H/O: GI bleed 09/03/2010    History  Smoking status  . Former Smoker  . Quit date: 01/11/2008  Smokeless tobacco  . Not on file    History  Alcohol Use No    Family History  Problem Relation Age of Onset  . Heart attack Father     Review of Systems: Constitutional: no fever chills diaphoresis or fatigue or change in weight.  Head and neck: no hearing loss, no epistaxis, no photophobia or visual disturbance. Respiratory: No cough, shortness  of breath or wheezing. Cardiovascular: No chest pain peripheral edema, palpitations. Gastrointestinal: No abdominal distention, no abdominal pain, no change in bowel habits hematochezia or melena. Genitourinary: No dysuria, no frequency, no urgency, no nocturia. Musculoskeletal:No arthralgias, no back pain, no gait disturbance or myalgias. Neurological: No dizziness, no headaches, no numbness, no seizures, no syncope, no weakness, no tremors. Hematologic: No lymphadenopathy, no easy bruising. Psychiatric: No confusion, no hallucinations, no sleep disturbance.    Physical Exam: Filed Vitals:   01/22/13 1109  BP: 172/68  Pulse: 90   the general appearance reveals a well-developed well-nourished woman in no distress.The head and neck exam reveals pupils equal and reactive.  Extraocular movements are full.  There is no scleral icterus.  The mouth and pharynx are normal.  The neck is supple.  The carotids reveal no bruits.  The jugular venous pressure is normal.  The  thyroid is not enlarged.  There is no lymphadenopathy.  The chest is clear to percussion and auscultation.  There are no rales or rhonchi.  Expansion of the chest is symmetrical.  The precordium is quiet.  The first heart sound is normal.  The second heart sound is physiologically split.  There is no murmur gallop rub or click.  There is no abnormal lift or heave.  The abdomen is soft and nontender.  The bowel sounds are normal.  The liver and spleen are not enlarged.  There are no abdominal masses.  There are no abdominal bruits.  Extremities reveal good pedal pulses.  There is no phlebitis or edema.  There is no cyanosis or clubbing.  Strength is normal and symmetrical in all extremities.  There is no lateralizing weakness.  There are no sensory deficits.  The skin is warm and dry.  There is no rash.     Assessment / Plan: Continue same medication.  Continue to use sublingual nitroglycerin when necessary for atypical chest pain.   Recheck in 6 months for followup office visit and EKG.  Recommended that she talk to her PCP or a urologist about her recent hematuria

## 2013-01-22 NOTE — Assessment & Plan Note (Signed)
The patient has not been aware of any recurrent palpitations and or atrial fibrillation.

## 2013-04-02 ENCOUNTER — Emergency Department (HOSPITAL_COMMUNITY)
Admission: EM | Admit: 2013-04-02 | Discharge: 2013-04-02 | Disposition: A | Discharge status: Home / Self Care | Payer: Medicare Other | Attending: Emergency Medicine | Admitting: Emergency Medicine

## 2013-04-02 ENCOUNTER — Encounter (HOSPITAL_COMMUNITY): Payer: Self-pay | Admitting: Emergency Medicine

## 2013-04-02 ENCOUNTER — Emergency Department (HOSPITAL_COMMUNITY): Payer: Medicare Other

## 2013-04-02 DIAGNOSIS — J441 Chronic obstructive pulmonary disease with (acute) exacerbation: Secondary | ICD-10-CM | POA: Insufficient documentation

## 2013-04-02 DIAGNOSIS — Z7982 Long term (current) use of aspirin: Secondary | ICD-10-CM | POA: Insufficient documentation

## 2013-04-02 DIAGNOSIS — J449 Chronic obstructive pulmonary disease, unspecified: Secondary | ICD-10-CM

## 2013-04-02 DIAGNOSIS — I10 Essential (primary) hypertension: Secondary | ICD-10-CM | POA: Insufficient documentation

## 2013-04-02 DIAGNOSIS — Z79899 Other long term (current) drug therapy: Secondary | ICD-10-CM | POA: Insufficient documentation

## 2013-04-02 DIAGNOSIS — Z9889 Other specified postprocedural states: Secondary | ICD-10-CM | POA: Insufficient documentation

## 2013-04-02 DIAGNOSIS — J45901 Unspecified asthma with (acute) exacerbation: Principal | ICD-10-CM

## 2013-04-02 DIAGNOSIS — D649 Anemia, unspecified: Secondary | ICD-10-CM

## 2013-04-02 DIAGNOSIS — Z87891 Personal history of nicotine dependence: Secondary | ICD-10-CM | POA: Insufficient documentation

## 2013-04-02 DIAGNOSIS — Z8719 Personal history of other diseases of the digestive system: Secondary | ICD-10-CM | POA: Insufficient documentation

## 2013-04-02 LAB — CBC WITH DIFFERENTIAL/PLATELET
BASOS PCT: 0 % (ref 0–1)
Basophils Absolute: 0 10*3/uL (ref 0.0–0.1)
EOS ABS: 0.1 10*3/uL (ref 0.0–0.7)
EOS PCT: 1 % (ref 0–5)
HCT: 29.1 % — ABNORMAL LOW (ref 36.0–46.0)
Hemoglobin: 8.4 g/dL — ABNORMAL LOW (ref 12.0–15.0)
LYMPHS ABS: 1.7 10*3/uL (ref 0.7–4.0)
Lymphocytes Relative: 23 % (ref 12–46)
MCH: 25.9 pg — AB (ref 26.0–34.0)
MCHC: 28.9 g/dL — AB (ref 30.0–36.0)
MCV: 89.8 fL (ref 78.0–100.0)
Monocytes Absolute: 0.7 10*3/uL (ref 0.1–1.0)
Monocytes Relative: 9 % (ref 3–12)
Neutro Abs: 4.9 10*3/uL (ref 1.7–7.7)
Neutrophils Relative %: 67 % (ref 43–77)
PLATELETS: 276 10*3/uL (ref 150–400)
RBC: 3.24 MIL/uL — AB (ref 3.87–5.11)
RDW: 15.3 % (ref 11.5–15.5)
WBC: 7.4 10*3/uL (ref 4.0–10.5)

## 2013-04-02 LAB — BASIC METABOLIC PANEL
BUN: 12 mg/dL (ref 6–23)
CALCIUM: 8.7 mg/dL (ref 8.4–10.5)
CO2: 29 mEq/L (ref 19–32)
Chloride: 104 mEq/L (ref 96–112)
Creatinine, Ser: 0.69 mg/dL (ref 0.50–1.10)
GFR calc Af Amer: >90 mL/min (ref >90)
GFR, EST NON AFRICAN AMERICAN: 79 mL/min — AB (ref >90)
GLUCOSE: 100 mg/dL — AB (ref 70–99)
Potassium: 3.7 mEq/L (ref 3.7–5.3)
SODIUM: 145 meq/L (ref 137–147)

## 2013-04-02 LAB — TROPONIN I: Troponin I: <0.3 ng/mL (ref <0.30)

## 2013-04-02 MED ORDER — PREDNISONE 20 MG PO TABS: ORAL_TABLET | ORAL | Status: DC

## 2013-04-02 MED ORDER — FERROUS SULFATE 325 (65 FE) MG PO TABS: 325.0000 mg | ORAL_TABLET | Freq: Every day | ORAL | Status: AC

## 2013-04-02 MED ORDER — METHYLPREDNISOLONE SODIUM SUCC 125 MG IJ SOLR
125.0000 mg | Freq: Once | INTRAMUSCULAR | Status: AC
  Administered 2013-04-02: 125 mg via INTRAVENOUS
  Filled 2013-04-02: qty 2

## 2013-04-02 NOTE — ED Provider Notes (Signed)
CSN: [id number]     Arrival date & time 04/02/13  0906 History   First MD Initiated Contact with Patient 04/02/13 [phone]     Chief Complaint  Patient presents with  . Respiratory Distress     (Consider location/radiation/quality/duration/timing/severity/associated sxs/prior Treatment) HPI.... shortness of breath for the past 24 hours. Patient has COPD and uses a home nebulizer machine. She is on 3 L of nasal oxygen at nighttime. No fever, chills, rusty sputum. She has a history of anemia and wonders if this may be contributing to her symptoms. Hemoglobin has been running in the 10.5 range.  Breathing treatment given by EMS.  Past Medical History  Diagnosis Date  . Hypertension   . COPD (chronic obstructive pulmonary disease)   . Aortic insufficiency     Mild to moderate per echo in Jan 2012  . Dizziness   . PAF (paroxysmal atrial fibrillation)   . History of GI bleed   . Anemia   . DOE (dyspnea on exertion)   . Chest pain     Minimal disease per cath in 2010  . Mild aortic stenosis     Per echo in Jan 2012  . Asthma    Past Surgical History  Procedure Laterality Date  . Cardiac catheterization  03/10/2008    EF 65%; Minimal luminal irregularities with no obstructive lesions  . Cholecystectomy    . Hernia repair    . Transthoracic echocardiogram  02/05/2010    EF 55-65%; Mild AS, mild to moderate AI  . Cardiovascular stress test  02/29/2008    EF 60%  . Ercp N/A 08/31/2012    Procedure: ENDOSCOPIC RETROGRADE CHOLANGIOPANCREATOGRAPHY (ERCP);  Surgeon: Willis Modena, MD;  [facility];  Service: Endoscopy;  Laterality: N/A;   Family History  Problem Relation Age of Onset  . Heart attack Father    History  Substance Use Topics  . Smoking status: Former Smoker    Quit date: 01/11/2008  . Smokeless tobacco: Not on file  . Alcohol Use: No   OB History   Grav Para Term Preterm Abortions TAB SAB Ect Mult Living                 Review of Systems  All other systems  reviewed and are negative.      Allergies  Amoxicillin and Lisinopril  Home Medications   Current Outpatient Rx  Name  Route  Sig  Dispense  Refill  . albuterol (PROVENTIL HFA;VENTOLIN HFA) 108 (90 BASE) MCG/ACT inhaler   Inhalation   Inhale 2 puffs into the lungs every 6 (six) hours as needed. For shortness of breath.         Marland Kitchen amitriptyline (ELAVIL) 50 MG tablet   Oral   Take 50 mg by mouth at bedtime.           Marland Kitchen aspirin 81 MG tablet   Oral   Take 81 mg by mouth daily.          Marland Kitchen atorvastatin (LIPITOR) 20 MG tablet   Oral   Take 20 mg by mouth daily at 6 PM.          . betamethasone valerate (VALISONE) 0.1 % cream   Topical   Apply 1 application topically as needed (bra burns).          . diphenhydrAMINE (BENADRYL) 25 MG tablet   Oral   Take 25 mg by mouth at bedtime as needed (watery eyes).         Marland Kitchen  furosemide (LASIX) 20 MG tablet   Oral   Take 20 mg by mouth daily.         Marland Kitchen loratadine (CLARITIN) 10 MG tablet   Oral   Take 10 mg by mouth daily.         Marland Kitchen losartan (COZAAR) 25 MG tablet   Oral   Take 25 mg by mouth daily.          . metFORMIN (GLUCOPHAGE) 500 MG tablet   Oral   Take 500 mg by mouth 2 (two) times daily with a meal.         . Multiple Vitamins-Minerals (CENTRUM ADULTS PO)   Oral   Take 1 tablet by mouth daily.         . nitroGLYCERIN (NITROSTAT) 0.4 MG SL tablet   Sublingual   Place 0.8 mg under the tongue every 5 (five) minutes as needed. For chest pain.         Marland Kitchen omeprazole (PRILOSEC) 20 MG capsule   Oral   Take 20 mg by mouth daily.          . ondansetron (ZOFRAN) 4 MG tablet   Oral   Take 1 tablet (4 mg total) by mouth every 8 (eight) hours as needed for nausea.   10 tablet   0   . verapamil (CALAN-SR) 240 MG CR tablet   Oral   Take 1 tablet (240 mg total) by mouth daily.   30 tablet   5     THIS IS A NEW DOSE   . ferrous sulfate 325 (65 FE) MG tablet   Oral   Take 1 tablet (325 mg total)  by mouth daily.   30 tablet   1   . fluticasone-salmeterol (ADVAIR HFA) 230-21 MCG/ACT inhaler   Inhalation   Inhale 1 puff into the lungs 2 (two) times daily.         . nitrofurantoin, macrocrystal-monohydrate, (MACROBID) 100 MG capsule               . predniSONE (DELTASONE) 20 MG tablet      3 tabs po day one, then 2 tabs daily x 4 days   11 tablet   0   . tiotropium (SPIRIVA) 18 MCG inhalation capsule   Inhalation   Place 18 mcg into inhaler and inhale daily.          BP 109/77  Pulse 71  Temp(Src) 97.6 F (36.4 C) (Oral)  Resp 20  Ht 5\' 4"  (1.626 m)  Wt 190 lb (86.183 kg)  BMI 32.60 kg/m2  SpO2 97% Physical Exam  Nursing note and vitals reviewed. Constitutional: She is oriented to person, place, and time. She appears well-developed and well-nourished.  HENT:  Head: Normocephalic and atraumatic.  Eyes: Conjunctivae and EOM are normal. Pupils are equal, round, and reactive to light.  Neck: Normal range of motion. Neck supple.  Cardiovascular: Normal rate, regular rhythm and normal heart sounds.   Pulmonary/Chest: Effort normal and breath sounds normal.  No obvious wheezing  Abdominal: Soft. Bowel sounds are normal.  Musculoskeletal: Normal range of motion.  Neurological: She is alert and oriented to person, place, and time.  Skin: Skin is warm and dry.  Psychiatric: She has a normal mood and affect. Her behavior is normal.    ED Course  Procedures (including critical care time) Labs Review Labs Reviewed  CBC WITH DIFFERENTIAL - Abnormal; Notable for the following:    RBC 3.24 (*)    Hemoglobin  8.4 (*)    HCT 29.1 (*)    MCH 25.9 (*)    MCHC 28.9 (*)    All other components within normal limits  BASIC METABOLIC PANEL - Abnormal; Notable for the following:    Glucose, Bld 100 (*)    GFR calc non Af Amer 79 (*)    All other components within normal limits  TROPONIN I   Imaging Review Dg Chest Port 1 View  04/02/2013   CLINICAL DATA:  Shortness  of breath.  EXAM: PORTABLE CHEST - 1 VIEW  COMPARISON:  08/28/2012  FINDINGS: Heart size and pulmonary vascularity are normal and the lungs are clear except for slight scarring at the left lung base. The lungs are slightly hyperinflated consistent with the history of COPD. No acute osseous abnormality.  IMPRESSION: No acute abnormality.  Emphysema.   Electronically Signed   By: Geanie CooleyJim  Maxwell M.D.   On: 04/02/2013 10:01     EKG Interpretation None      Date: 04/02/2013  Rate: 70  Rhythm: normal sinus rhythm  QRS Axis: normal  Intervals: normal  ST/T Wave abnormalities: normal  Conduction Disutrbances: none  Narrative Interpretation: unremarkable    MDM   Final diagnoses:  COPD (chronic obstructive pulmonary disease)  Anemia    Patient feels better after albuterol/Atrovent nebulizer along with IV Solumedrol. Hemoglobin noted 8.4.  She has a history of anemia.   This was discussed with Dr. Felipa EthAvva.  Will start iron replacement. She will followup with her primary care doctor within the week.    Donnetta HutchingBrian Bryanne Riquelme, MD 04/02/13 1311

## 2013-04-02 NOTE — ED Notes (Signed)
TO ED via [facility] from home, with c/o shortness of breath--hx of emphysema, COPD, Asthma-- uses nebulizer at home occasionally

## 2013-04-02 NOTE — ED Notes (Addendum)
To ED receiving resp treatment #2-- finished first enroute. Is able to speak in complete sentences, states feeling lightheaded from treatment,. Dr. Adriana Simasook at bedside. Daughter at bedside

## 2013-04-02 NOTE — Discharge Instructions (Signed)
Chest x-ray was normal. You are anemic. Hemoglobin was 8.4      discussed your situation with Dr. Felipa EthAvva.     He recommended starting you on iron replacement.   Initially I will prescribe prednisone. Call office and schedule appointment for approximately one week

## 2013-04-04 ENCOUNTER — Emergency Department (HOSPITAL_COMMUNITY): Payer: Medicare Other

## 2013-04-04 ENCOUNTER — Encounter (HOSPITAL_COMMUNITY): Payer: Self-pay | Admitting: Emergency Medicine

## 2013-04-04 ENCOUNTER — Inpatient Hospital Stay (HOSPITAL_COMMUNITY)
Admission: EM | Admit: 2013-04-04 | Discharge: 2013-04-07 | DRG: 190 | Disposition: A | Discharge status: Home / Self Care | Payer: Medicare Other | Attending: Internal Medicine | Admitting: Internal Medicine

## 2013-04-04 DIAGNOSIS — R740 Nonspecific elevation of levels of transaminase and lactic acid dehydrogenase [LDH]: Secondary | ICD-10-CM

## 2013-04-04 DIAGNOSIS — K5900 Constipation, unspecified: Secondary | ICD-10-CM | POA: Diagnosis present

## 2013-04-04 DIAGNOSIS — Z9089 Acquired absence of other organs: Secondary | ICD-10-CM

## 2013-04-04 DIAGNOSIS — Z8719 Personal history of other diseases of the digestive system: Secondary | ICD-10-CM

## 2013-04-04 DIAGNOSIS — I359 Nonrheumatic aortic valve disorder, unspecified: Secondary | ICD-10-CM | POA: Diagnosis present

## 2013-04-04 DIAGNOSIS — R7401 Elevation of levels of liver transaminase levels: Secondary | ICD-10-CM

## 2013-04-04 DIAGNOSIS — I4891 Unspecified atrial fibrillation: Secondary | ICD-10-CM

## 2013-04-04 DIAGNOSIS — Z8249 Family history of ischemic heart disease and other diseases of the circulatory system: Secondary | ICD-10-CM

## 2013-04-04 DIAGNOSIS — Z888 Allergy status to other drugs, medicaments and biological substances status: Secondary | ICD-10-CM

## 2013-04-04 DIAGNOSIS — D649 Anemia, unspecified: Secondary | ICD-10-CM | POA: Diagnosis present

## 2013-04-04 DIAGNOSIS — Z7982 Long term (current) use of aspirin: Secondary | ICD-10-CM

## 2013-04-04 DIAGNOSIS — Z79899 Other long term (current) drug therapy: Secondary | ICD-10-CM

## 2013-04-04 DIAGNOSIS — E119 Type 2 diabetes mellitus without complications: Secondary | ICD-10-CM | POA: Diagnosis present

## 2013-04-04 DIAGNOSIS — I38 Endocarditis, valve unspecified: Secondary | ICD-10-CM

## 2013-04-04 DIAGNOSIS — IMO0002 Reserved for concepts with insufficient information to code with codable children: Secondary | ICD-10-CM

## 2013-04-04 DIAGNOSIS — K8050 Calculus of bile duct without cholangitis or cholecystitis without obstruction: Secondary | ICD-10-CM

## 2013-04-04 DIAGNOSIS — J45901 Unspecified asthma with (acute) exacerbation: Secondary | ICD-10-CM

## 2013-04-04 DIAGNOSIS — Z87891 Personal history of nicotine dependence: Secondary | ICD-10-CM

## 2013-04-04 DIAGNOSIS — J441 Chronic obstructive pulmonary disease with (acute) exacerbation: Secondary | ICD-10-CM | POA: Diagnosis present

## 2013-04-04 DIAGNOSIS — J189 Pneumonia, unspecified organism: Secondary | ICD-10-CM | POA: Diagnosis present

## 2013-04-04 DIAGNOSIS — J440 Chronic obstructive pulmonary disease with acute lower respiratory infection: Principal | ICD-10-CM | POA: Diagnosis present

## 2013-04-04 DIAGNOSIS — R0789 Other chest pain: Secondary | ICD-10-CM

## 2013-04-04 DIAGNOSIS — Z881 Allergy status to other antibiotic agents status: Secondary | ICD-10-CM

## 2013-04-04 DIAGNOSIS — J209 Acute bronchitis, unspecified: Principal | ICD-10-CM | POA: Diagnosis present

## 2013-04-04 DIAGNOSIS — J449 Chronic obstructive pulmonary disease, unspecified: Secondary | ICD-10-CM | POA: Diagnosis present

## 2013-04-04 DIAGNOSIS — E785 Hyperlipidemia, unspecified: Secondary | ICD-10-CM | POA: Diagnosis present

## 2013-04-04 DIAGNOSIS — I10 Essential (primary) hypertension: Secondary | ICD-10-CM | POA: Diagnosis present

## 2013-04-04 DIAGNOSIS — K219 Gastro-esophageal reflux disease without esophagitis: Secondary | ICD-10-CM | POA: Diagnosis present

## 2013-04-04 DIAGNOSIS — J9610 Chronic respiratory failure, unspecified whether with hypoxia or hypercapnia: Secondary | ICD-10-CM | POA: Diagnosis present

## 2013-04-04 DIAGNOSIS — K5730 Diverticulosis of large intestine without perforation or abscess without bleeding: Secondary | ICD-10-CM | POA: Diagnosis present

## 2013-04-04 LAB — CBC WITH DIFFERENTIAL/PLATELET
BASOS ABS: 0 10*3/uL (ref 0.0–0.1)
Basophils Relative: 0 % (ref 0–1)
EOS ABS: 0 10*3/uL (ref 0.0–0.7)
EOS PCT: 0 % (ref 0–5)
HCT: 28.7 % — ABNORMAL LOW (ref 36.0–46.0)
Hemoglobin: 8.2 g/dL — ABNORMAL LOW (ref 12.0–15.0)
LYMPHS PCT: 14 % (ref 12–46)
Lymphs Abs: 2.1 10*3/uL (ref 0.7–4.0)
MCH: 25.8 pg — AB (ref 26.0–34.0)
MCHC: 28.6 g/dL — ABNORMAL LOW (ref 30.0–36.0)
MCV: 90.3 fL (ref 78.0–100.0)
Monocytes Absolute: 1.1 10*3/uL — ABNORMAL HIGH (ref 0.1–1.0)
Monocytes Relative: 8 % (ref 3–12)
Neutro Abs: 11.9 10*3/uL — ABNORMAL HIGH (ref 1.7–7.7)
Neutrophils Relative %: 79 % — ABNORMAL HIGH (ref 43–77)
PLATELETS: 334 10*3/uL (ref 150–400)
RBC: 3.18 MIL/uL — ABNORMAL LOW (ref 3.87–5.11)
RDW: 15.7 % — AB (ref 11.5–15.5)
WBC: 15.1 10*3/uL — AB (ref 4.0–10.5)

## 2013-04-04 LAB — COMPREHENSIVE METABOLIC PANEL
ALBUMIN: 3.3 g/dL — AB (ref 3.5–5.2)
ALT: 21 U/L (ref 0–35)
AST: 22 U/L (ref 0–37)
Alkaline Phosphatase: 71 U/L (ref 39–117)
BUN: 17 mg/dL (ref 6–23)
CALCIUM: 8.7 mg/dL (ref 8.4–10.5)
CO2: 31 mEq/L (ref 19–32)
CREATININE: 0.7 mg/dL (ref 0.50–1.10)
Chloride: 106 mEq/L (ref 96–112)
GFR calc Af Amer: >90 mL/min (ref >90)
GFR calc non Af Amer: 79 mL/min — ABNORMAL LOW (ref >90)
Glucose, Bld: 93 mg/dL (ref 70–99)
Potassium: 3.9 mEq/L (ref 3.7–5.3)
Sodium: 146 mEq/L (ref 137–147)
Total Bilirubin: 0.3 mg/dL (ref 0.3–1.2)
Total Protein: 6 g/dL (ref 6.0–8.3)

## 2013-04-04 LAB — I-STAT TROPONIN, ED: TROPONIN I, POC: 0.03 ng/mL (ref 0.00–0.08)

## 2013-04-04 LAB — GLUCOSE, CAPILLARY
GLUCOSE-CAPILLARY: 239 mg/dL — AB (ref 70–99)
GLUCOSE-CAPILLARY: 184 mg/dL — AB (ref 70–99)

## 2013-04-04 LAB — IRON AND TIBC
Iron: 13 ug/dL — ABNORMAL LOW (ref 42–135)
Saturation Ratios: 3 % — ABNORMAL LOW (ref 20–55)
TIBC: 410 ug/dL (ref 250–470)
UIBC: 397 ug/dL (ref 125–400)

## 2013-04-04 LAB — CBC
HCT: 27.9 % — ABNORMAL LOW (ref 36.0–46.0)
HEMOGLOBIN: 8.1 g/dL — AB (ref 12.0–15.0)
MCH: 26.1 pg (ref 26.0–34.0)
MCHC: 29 g/dL — ABNORMAL LOW (ref 30.0–36.0)
MCV: 90 fL (ref 78.0–100.0)
PLATELETS: 317 10*3/uL (ref 150–400)
RBC: 3.1 MIL/uL — AB (ref 3.87–5.11)
RDW: 15.8 % — ABNORMAL HIGH (ref 11.5–15.5)
WBC: 13.7 10*3/uL — AB (ref 4.0–10.5)

## 2013-04-04 LAB — PRO B NATRIURETIC PEPTIDE: Pro B Natriuretic peptide (BNP): 1042 pg/mL — ABNORMAL HIGH (ref 0–450)

## 2013-04-04 LAB — CBG MONITORING, ED: GLUCOSE-CAPILLARY: 135 mg/dL — AB (ref 70–99)

## 2013-04-04 LAB — HEMOGLOBIN A1C
HEMOGLOBIN A1C: 5.8 % — AB (ref <5.7)
Mean Plasma Glucose: 120 mg/dL — ABNORMAL HIGH (ref <117)

## 2013-04-04 LAB — CREATININE, SERUM
CREATININE: 0.66 mg/dL (ref 0.50–1.10)
GFR, EST NON AFRICAN AMERICAN: 81 mL/min — AB (ref >90)

## 2013-04-04 MED ORDER — ACETAMINOPHEN 325 MG PO TABS
650.0000 mg | ORAL_TABLET | Freq: Four times a day (QID) | ORAL | Status: DC | PRN
  Administered 2013-04-06: 650 mg via ORAL
  Filled 2013-04-04: qty 2

## 2013-04-04 MED ORDER — ONDANSETRON HCL 4 MG/2ML IJ SOLN: 4.0000 mg | Freq: Four times a day (QID) | INTRAMUSCULAR | Status: DC | PRN

## 2013-04-04 MED ORDER — ASPIRIN EC 325 MG PO TBEC
325.0000 mg | DELAYED_RELEASE_TABLET | Freq: Once | ORAL | Status: DC
  Filled 2013-04-04: qty 1

## 2013-04-04 MED ORDER — ACETAMINOPHEN 650 MG RE SUPP: 650.0000 mg | Freq: Four times a day (QID) | RECTAL | Status: DC | PRN

## 2013-04-04 MED ORDER — SODIUM CHLORIDE 0.9 % IJ SOLN: 3.0000 mL | INTRAMUSCULAR | Status: DC | PRN

## 2013-04-04 MED ORDER — INSULIN ASPART 100 UNIT/ML ~~LOC~~ SOLN
0.0000 [IU] | Freq: Three times a day (TID) | SUBCUTANEOUS | Status: DC
  Administered 2013-04-04: 5 [IU] via SUBCUTANEOUS
  Administered 2013-04-05: 2 [IU] via SUBCUTANEOUS
  Administered 2013-04-05: 3 [IU] via SUBCUTANEOUS
  Administered 2013-04-05: 8 [IU] via SUBCUTANEOUS
  Administered 2013-04-06 (×2): 3 [IU] via SUBCUTANEOUS
  Administered 2013-04-06: 5 [IU] via SUBCUTANEOUS
  Administered 2013-04-07: 3 [IU] via SUBCUTANEOUS
  Administered 2013-04-07: 5 [IU] via SUBCUTANEOUS
  Administered 2013-04-07: 3 [IU] via SUBCUTANEOUS

## 2013-04-04 MED ORDER — DEXTROSE 5 % IV SOLN
1.0000 g | INTRAVENOUS | Status: DC
  Administered 2013-04-04 – 2013-04-06 (×3): 1 g via INTRAVENOUS
  Filled 2013-04-04 (×4): qty 10

## 2013-04-04 MED ORDER — NITROGLYCERIN 0.4 MG SL SUBL: 0.4000 mg | SUBLINGUAL_TABLET | SUBLINGUAL | Status: DC | PRN

## 2013-04-04 MED ORDER — METHYLPREDNISOLONE SODIUM SUCC 125 MG IJ SOLR
125.0000 mg | Freq: Once | INTRAMUSCULAR | Status: AC
  Administered 2013-04-04: 125 mg via INTRAVENOUS
  Filled 2013-04-04: qty 2

## 2013-04-04 MED ORDER — METHYLPREDNISOLONE SODIUM SUCC 125 MG IJ SOLR
60.0000 mg | Freq: Four times a day (QID) | INTRAMUSCULAR | Status: DC
  Administered 2013-04-04 – 2013-04-05 (×5): 60 mg via INTRAVENOUS
  Administered 2013-04-06: 13:00:00 via INTRAVENOUS
  Administered 2013-04-06 – 2013-04-07 (×6): 60 mg via INTRAVENOUS
  Filled 2013-04-04 (×15): qty 0.96

## 2013-04-04 MED ORDER — MOMETASONE FURO-FORMOTEROL FUM 200-5 MCG/ACT IN AERO
2.0000 | INHALATION_SPRAY | Freq: Two times a day (BID) | RESPIRATORY_TRACT | Status: DC
  Administered 2013-04-04 – 2013-04-07 (×6): 2 via RESPIRATORY_TRACT
  Filled 2013-04-04 (×2): qty 8.8

## 2013-04-04 MED ORDER — FUROSEMIDE 20 MG PO TABS
20.0000 mg | ORAL_TABLET | Freq: Every day | ORAL | Status: DC
  Administered 2013-04-04 – 2013-04-07 (×4): 20 mg via ORAL
  Filled 2013-04-04 (×4): qty 1

## 2013-04-04 MED ORDER — AMITRIPTYLINE HCL 50 MG PO TABS
50.0000 mg | ORAL_TABLET | Freq: Every day | ORAL | Status: DC
  Administered 2013-04-04 – 2013-04-06 (×3): 50 mg via ORAL
  Filled 2013-04-04 (×4): qty 1

## 2013-04-04 MED ORDER — VERAPAMIL HCL ER 240 MG PO TBCR
240.0000 mg | EXTENDED_RELEASE_TABLET | Freq: Every day | ORAL | Status: DC
  Administered 2013-04-04 – 2013-04-07 (×4): 240 mg via ORAL
  Filled 2013-04-04 (×5): qty 1

## 2013-04-04 MED ORDER — IPRATROPIUM-ALBUTEROL 0.5-2.5 (3) MG/3ML IN SOLN
3.0000 mL | RESPIRATORY_TRACT | Status: DC
  Administered 2013-04-04 – 2013-04-06 (×12): 3 mL via RESPIRATORY_TRACT
  Filled 2013-04-04 (×11): qty 3

## 2013-04-04 MED ORDER — BENZONATATE 100 MG PO CAPS
100.0000 mg | ORAL_CAPSULE | Freq: Two times a day (BID) | ORAL | Status: DC | PRN
  Filled 2013-04-04: qty 1

## 2013-04-04 MED ORDER — PANTOPRAZOLE SODIUM 40 MG PO TBEC
40.0000 mg | DELAYED_RELEASE_TABLET | Freq: Two times a day (BID) | ORAL | Status: DC
  Administered 2013-04-04 – 2013-04-07 (×7): 40 mg via ORAL
  Filled 2013-04-04 (×7): qty 1

## 2013-04-04 MED ORDER — SODIUM CHLORIDE 0.9 % IV SOLN: 250.0000 mL | INTRAVENOUS | Status: DC | PRN

## 2013-04-04 MED ORDER — ONDANSETRON HCL 4 MG PO TABS: 4.0000 mg | ORAL_TABLET | Freq: Four times a day (QID) | ORAL | Status: DC | PRN

## 2013-04-04 MED ORDER — INSULIN ASPART 100 UNIT/ML ~~LOC~~ SOLN: 0.0000 [IU] | Freq: Every day | SUBCUTANEOUS | Status: DC

## 2013-04-04 MED ORDER — DEXTROSE 5 % IV SOLN
500.0000 mg | INTRAVENOUS | Status: DC
  Administered 2013-04-04 – 2013-04-06 (×3): 500 mg via INTRAVENOUS
  Filled 2013-04-04 (×3): qty 500

## 2013-04-04 MED ORDER — SODIUM CHLORIDE 0.9 % IJ SOLN: 3.0000 mL | Freq: Two times a day (BID) | INTRAMUSCULAR | Status: DC

## 2013-04-04 MED ORDER — ATORVASTATIN CALCIUM 20 MG PO TABS
20.0000 mg | ORAL_TABLET | Freq: Every day | ORAL | Status: DC
  Administered 2013-04-04 – 2013-04-07 (×4): 20 mg via ORAL
  Filled 2013-04-04 (×4): qty 1

## 2013-04-04 MED ORDER — LOSARTAN POTASSIUM 25 MG PO TABS
25.0000 mg | ORAL_TABLET | Freq: Every day | ORAL | Status: DC
  Administered 2013-04-04 – 2013-04-07 (×4): 25 mg via ORAL
  Filled 2013-04-04 (×6): qty 1

## 2013-04-04 MED ORDER — OXYCODONE HCL 5 MG PO TABS
5.0000 mg | ORAL_TABLET | ORAL | Status: DC | PRN
  Administered 2013-04-06 – 2013-04-07 (×3): 5 mg via ORAL
  Filled 2013-04-04 (×3): qty 1

## 2013-04-04 MED ORDER — ALUM & MAG HYDROXIDE-SIMETH 200-200-20 MG/5ML PO SUSP: 30.0000 mL | Freq: Four times a day (QID) | ORAL | Status: DC | PRN

## 2013-04-04 MED ORDER — SODIUM CHLORIDE 0.9 % IJ SOLN
3.0000 mL | Freq: Two times a day (BID) | INTRAMUSCULAR | Status: DC
  Administered 2013-04-04 – 2013-04-07 (×6): 3 mL via INTRAVENOUS

## 2013-04-04 NOTE — ED Notes (Signed)
IV attempt failed x 2 

## 2013-04-04 NOTE — ED Notes (Signed)
Pt given a pillow and a drink. She is happy and has no complaints

## 2013-04-04 NOTE — ED Notes (Signed)
Per EMS:  patietn woke up 0400 and was sitting on edge of bed with shortness of breath, normally wears O2 at night, has been increasingly weaker over 2 weeks, was found to be 86 % on 2L upon arrival, patient had tried her inhaler but was too weak  Wheezes throughout, received 2 duonebs PTA, hx of afib, DM, COPD, asthma, here last Tuesday.  Patient is currently 100% with breathing tx, in no apparent distress.  Per pt and family:  Pt reports getting weaker and weaker over 2-3 weeks until she couldn't even get to the bathroom, cant even get out of bed, takes iron because her hemoglobin was 8.4 since she last checked it.  Patient reports fevers or chills last night.

## 2013-04-04 NOTE — H&P (Signed)
[facility] History and Physical  Sheri Keelnn R Nam ZOX:[id number] DOB: 09/17/1929 DOA: 04/04/2013  Referring physician:  PCP: Hoyle SauerAVVA,RAVISANKAR R, MD   Chief Complaint: Shortness of breath  HPI: [name] is a 80 y.o. female with a past medical history of paroxysmal atrial fibrillation, not on anticoagulation, history GI bleed in April of 2013, chronic obstructive pulmonary disease, chronic hypoxemic respiratory failure, requiring 3 L of mental oxygen at baseline, history of tobacco abuse quitting 5 years ago, presenting to the emergency room with complaints of worsening cough and shortness of breath. She reports having progressively worsening shortness of breath over the past 2 weeks. She was seen in the emergency room on 04/02/2013 where she was discharged on a steroid taper. Despite this intervention symptoms continued to worsen. This morning her shortness of breath worsening, having difficulties tolerating physical exertion, unable to perform her usual activities. She complains of associated cough, having yellow sputum production. She denies sick contacts or recent travels, presently residing in the community. She denies fevers, chills, chest pain, extremity edema, palpitations, syncope or presyncope. Initial lab work in the emergency room showed a hemoglobin of 8.2. She denies hematemesis or bright red blood per rectum however reports occasionally noticing dark stools however she is on iron therapy.                                                                                                   Review of Systems:  Constitutional:  No weight loss, night sweats, Fevers, chills, fatigue. Positive for generalized weakness HEENT:  No headaches, Difficulty swallowing,Tooth/dental problems,Sore throat,  No sneezing, itching, ear ache, nasal congestion, post nasal drip,  Cardio-vascular:  No chest pain, Orthopnea, PND, swelling in lower extremities, anasarca, dizziness, palpitations  GI:  No  heartburn, indigestion, abdominal pain, nausea, vomiting, diarrhea, change in bowel habits, loss of appetite  Resp:  No shortness of breath with exertion or at rest. No excess mucus, no productive cough, No non-productive cough, No coughing up of blood.No change in color of mucus.No wheezing.No chest wall deformity  Skin:  no rash or lesions.  GU:  no dysuria, change in color of urine, no urgency or frequency. No flank pain.  Musculoskeletal:  No joint pain or swelling. No decreased range of motion. No back pain.  Psych:  No change in mood or affect. No depression or anxiety. No memory loss.   Past Medical History  Diagnosis Date  . Hypertension   . COPD (chronic obstructive pulmonary disease)   . Aortic insufficiency     Mild to moderate per echo in Jan 2012  . Dizziness   . PAF (paroxysmal atrial fibrillation)   . History of GI bleed   . Anemia   . DOE (dyspnea on exertion)   . Chest pain     Minimal disease per cath in 2010  . Mild aortic stenosis     Per echo in Jan 2012  . Asthma    Past Surgical History  Procedure Laterality Date  . Cardiac catheterization  03/10/2008    EF 65%; Minimal luminal irregularities with no obstructive  lesions  . Cholecystectomy    . Hernia repair    . Transthoracic echocardiogram  02/05/2010    EF 55-65%; Mild AS, mild to moderate AI  . Cardiovascular stress test  02/29/2008    EF 60%  . Ercp N/A 08/31/2012    Procedure: ENDOSCOPIC RETROGRADE CHOLANGIOPANCREATOGRAPHY (ERCP);  Surgeon: Willis Modena, MD;  Location: [facility] OR;  Service: Endoscopy;  Laterality: N/A;   Social History:  reports that she quit smoking about 5 years ago. She does not have any smokeless tobacco history on file. She reports that she does not drink alcohol or use illicit drugs.  Allergies  Allergen Reactions  . Amoxicillin Diarrhea  . Lisinopril Swelling    Swelling of tongue    Family History  Problem Relation Age of Onset  . Heart attack Father      Prior to  Admission medications   Medication Sig Start Date End Date Taking? Authorizing Provider  albuterol (PROVENTIL HFA;VENTOLIN HFA) 108 (90 BASE) MCG/ACT inhaler Inhale 2 puffs into the lungs every 6 (six) hours as needed. For shortness of breath.   Yes Historical Provider, MD  amitriptyline (ELAVIL) 50 MG tablet Take 50 mg by mouth at bedtime.     Yes Historical Provider, MD  aspirin 81 MG tablet Take 81 mg by mouth daily.    Yes Historical Provider, MD  atorvastatin (LIPITOR) 20 MG tablet Take 20 mg by mouth daily at 6 PM.  01/14/13  Yes Historical Provider, MD  betamethasone valerate (VALISONE) 0.1 % cream Apply 1 application topically as needed (bra burns).    Yes Historical Provider, MD  diphenhydrAMINE (BENADRYL) 25 MG tablet Take 25 mg by mouth at bedtime as needed (watery eyes).   Yes Historical Provider, MD  ferrous sulfate 325 (65 FE) MG tablet Take 1 tablet (325 mg total) by mouth daily. 04/02/13  Yes Donnetta Hutching, MD  fluticasone-salmeterol (ADVAIR HFA) 230-21 MCG/ACT inhaler Inhale 1 puff into the lungs 2 (two) times daily.   Yes Historical Provider, MD  furosemide (LASIX) 20 MG tablet Take 20 mg by mouth daily.   Yes Historical Provider, MD  loratadine (CLARITIN) 10 MG tablet Take 10 mg by mouth daily.   Yes Historical Provider, MD  losartan (COZAAR) 25 MG tablet Take 25 mg by mouth daily.  01/14/13  Yes Historical Provider, MD  metFORMIN (GLUCOPHAGE) 500 MG tablet Take 500 mg by mouth 2 (two) times daily with a meal.   Yes Historical Provider, MD  Multiple Vitamins-Minerals (CENTRUM ADULTS PO) Take 1 tablet by mouth daily.   Yes Historical Provider, MD  omeprazole (PRILOSEC) 20 MG capsule Take 20 mg by mouth daily.    Yes Historical Provider, MD  predniSONE (DELTASONE) 20 MG tablet Take 20 mg by mouth See admin instructions. Taper as directed: Take 60 mg on first day, then 40 mg for four days thereafter.   Yes Historical Provider, MD  tiotropium (SPIRIVA) 18 MCG inhalation capsule Place 18 mcg  into inhaler and inhale daily.   Yes Historical Provider, MD  verapamil (CALAN-SR) 240 MG CR tablet Take 1 tablet (240 mg total) by mouth daily. 11/21/11  Yes Cassell Clement, MD  nitroGLYCERIN (NITROSTAT) 0.4 MG SL tablet Place 0.8 mg under the tongue every 5 (five) minutes as needed. For chest pain.    Historical Provider, MD   Physical Exam: Filed Vitals:   04/04/13 1100  BP:   Pulse: 86  Temp:   Resp: 22    BP 149/46  Pulse 86  Temp(Src) 98 F (36.7 C) (Oral)  Resp 22  Ht 5\' 4"  (1.626 m)  Wt 82.555 kg (182 lb)  BMI 31.22 kg/m2  SpO2 94%  General:  Patient in no significant distress, she does not appear to be utilizing accessory muscles, reports feeling better after receiving a breathing treatment in the emergency room Eyes: PERRL, normal lids, irises & conjunctiva ENT: grossly normal hearing, lips & tongue Neck: no LAD, masses or thyromegaly Cardiovascular: RRR, no m/r/g. No LE edema. Telemetry: SR, no arrhythmias  Respiratory: Diminished breath sounds bilaterally with expiratory wheezes, or rhonchi bilaterally, on supplemental oxygen via nasal cannula Abdomen: soft, ntnd Skin: no rash or induration seen on limited exam Musculoskeletal: grossly normal tone BUE/BLE Psychiatric: grossly normal mood and affect, speech fluent and appropriate Neurologic: grossly non-focal.          Labs on Admission:  Basic Metabolic Panel:  Recent Labs Lab 04/02/13 0947 04/04/13 0830  NA 145 146  K 3.7 3.9  CL 104 106  CO2 29 31  GLUCOSE 100* 93  BUN 12 17  CREATININE 0.69 0.70  CALCIUM 8.7 8.7   Liver Function Tests:  Recent Labs Lab 04/04/13 0830  AST 22  ALT 21  ALKPHOS 71  BILITOT 0.3  PROT 6.0  ALBUMIN 3.3*   No results found for this basename: LIPASE, AMYLASE,  in the last 168 hours No results found for this basename: AMMONIA,  in the last 168 hours CBC:  Recent Labs Lab 04/02/13 0947 04/04/13 0830  WBC 7.4 15.1*  NEUTROABS 4.9 11.9*  HGB 8.4* 8.2*    HCT 29.1* 28.7*  MCV 89.8 90.3  PLT 276 334   Cardiac Enzymes:  Recent Labs Lab 04/02/13 0947  TROPONINI <0.30    BNP (last 3 results)  Recent Labs  04/04/13 0830  PROBNP 1042.0*   CBG: No results found for this basename: GLUCAP,  in the last 168 hours  Radiological Exams on Admission: Dg Chest 2 View  04/04/2013   CLINICAL DATA:  COPD, asthma attack  EXAM: CHEST  2 VIEW  COMPARISON:  04/02/2013  FINDINGS: Cardiac shadow is stable. The lungs are again hyperinflated consistent with the given clinical history. Mild interstitial changes are noted. Slight increased density is noted in the bases bilaterally consistent with mild atelectasis. No acute bony abnormality is seen.  IMPRESSION: Mild bibasilar atelectasis.   Electronically Signed   By: Alcide Clever M.D.   On: 04/04/2013 09:31    EKG: Independently reviewed.   Assessment/Plan Principal Problem:   COPD exacerbation Active Problems:   CAP (community acquired pneumonia)   HTN (hypertension)   COPD (chronic obstructive pulmonary disease)   Atrial fibrillation   DM (diabetes mellitus)   1. Probable COPD exacerbation. Patient presented with increasing cough and shortness of breath, having a history of COPD. Chest x-ray showing hyperinflated lungs. She was recently seen in the emergency room discharged on prednisone taper. Will start Solu-Medrol 60 mg IV every 6 hours, scheduled duo nebs every 4 hours, empiric IV antibiotic therapy. 2. Possible community acquired pneumonia. It is possible underlying infectious process may have precipitated COPD exacerbation, will cover with empiric IV antibiotic therapy with azithromycin and ceftriaxone. Repeat chest x-ray in a.m. 3. Acute on chronic anemia. Patient having history of GI bleed in 2013. She denies bright red blood per rectum or hematemesis, reports of occasional dark stools. Will check stool Hemoccult, iron panel. Followup on repeat H&H. Will hold off on antiplatelet therapy  until Guaiac results are  known. Provide Protonix 40 mg by mouth twice a day. 4. History of paroxysmal atrial fibrillation. Not found to be anticoagulation candidate due to history of GI bleed. Currently in sinus rhythm.  5. Hypertension. Continue from now on Cozaar with holding parameters 6. Type 2 diabetes mellitus. We'll discontinue metformin therapy, check a hemoglobin A1c, provide Accu-Cheks q. a.c. and each bedtime with [place] coverage 7. DVT prophylaxis. Bilateral extremity SCDs    Code Status: Full code  Family Communication: I spoke with patient's daughter present at bedside  Disposition Plan: Will admit to inpatient service, and his facial require greater than 2 nights hospitalization  Time spent: 70 min  Jeralyn Bennett [facility] Pager [phone]

## 2013-04-04 NOTE — ED Notes (Signed)
Dr. Vanessa BarbaraZamora called for admitting order.

## 2013-04-04 NOTE — ED Provider Notes (Signed)
I saw and evaluated the patient, reviewed the resident's note and I agree with the findings and plan.   EKG Interpretation   Date/Time:  Thursday April 04 2013 08:09:57 EDT Ventricular Rate:  83 PR Interval:  128 QRS Duration: 94 QT Interval:  406 QTC Calculation: 477 R Axis:   61 Text Interpretation:  Sinus rhythm Anterior infarct, old Borderline  repolarization abnormality Confirmed by Wylder Macomber,  DO, Tareq Dwan ([id number]) on  04/04/2013 8:27:47 AM     Pt is an 80 yo F with history of COPD who presents emergency department for a second visit in 2 days for shortness of breath is worse with exertion. She has mild wheezing on exam. Concern for COPD exacerbation versus anginal equivalent. Chest x-ray clear. Labs unremarkable. Troponin negative. EKG shows no new ischemic changes. Discuss with medicine for admission. She has received steroids and DuoNeb. She is chronically on oxygen but her oxygen requirement has increased.    Layla MawKristen N Cassidi Modesitt, DO 04/04/13 1710

## 2013-04-04 NOTE — ED Notes (Signed)
Dr.Brtalik at bedside  

## 2013-04-04 NOTE — ED Provider Notes (Signed)
CSN: [id number]     Arrival date & time 04/04/13  0803 History   First MD Initiated Contact with Patient 04/04/13 [phone]     Chief Complaint  Patient presents with  . Shortness of Breath     (Consider location/radiation/quality/duration/timing/severity/associated sxs/prior Treatment) Patient is a 80 y.o. female presenting with shortness of breath. The history is provided by the patient and medical records.  Shortness of Breath Severity:  Moderate Onset quality:  Gradual Duration:  4 days Timing:  Constant Progression:  Worsening Chronicity:  Recurrent (hx of COPD and asthma) Context: activity and URI   Relieved by:  Inhaler and oxygen Worsened by:  Activity Ineffective treatments:  Inhaler Associated symptoms: cough (non productive), fever (subj) and wheezing   Associated symptoms: no abdominal pain, no chest pain, no hemoptysis, no rash, no sore throat, no syncope and no vomiting   Risk factors: obesity and tobacco use   Risk factors: no recent alcohol use, no family hx of DVT, no hx of PE/DVT, no prolonged immobilization and no recent surgery     Past Medical History  Diagnosis Date  . Hypertension   . COPD (chronic obstructive pulmonary disease)   . Aortic insufficiency     Mild to moderate per echo in Jan 2012  . Dizziness   . PAF (paroxysmal atrial fibrillation)   . History of GI bleed   . Anemia   . DOE (dyspnea on exertion)   . Chest pain     Minimal disease per cath in 2010  . Mild aortic stenosis     Per echo in Jan 2012  . Asthma    Past Surgical History  Procedure Laterality Date  . Cardiac catheterization  03/10/2008    EF 65%; Minimal luminal irregularities with no obstructive lesions  . Cholecystectomy    . Hernia repair    . Transthoracic echocardiogram  02/05/2010    EF 55-65%; Mild AS, mild to moderate AI  . Cardiovascular stress test  02/29/2008    EF 60%  . Ercp N/A 08/31/2012    Procedure: ENDOSCOPIC RETROGRADE CHOLANGIOPANCREATOGRAPHY (ERCP);   Surgeon: Willis Modena, MD;  Location: [facility] OR;  Service: Endoscopy;  Laterality: N/A;   Family History  Problem Relation Age of Onset  . Heart attack Father    History  Substance Use Topics  . Smoking status: Former Smoker    Quit date: 01/11/2008  . Smokeless tobacco: Not on file  . Alcohol Use: No   OB History   Grav Para Term Preterm Abortions TAB SAB Ect Mult Living                 Review of Systems  Constitutional: Positive for fever (subj). Negative for activity change and appetite change.  HENT: Negative for congestion and sore throat.   Eyes: Negative for discharge, redness and itching.  Respiratory: Positive for cough (non productive), shortness of breath and wheezing. Negative for hemoptysis.   Cardiovascular: Negative for chest pain and syncope.  Gastrointestinal: Negative for nausea, vomiting, abdominal pain and diarrhea.  Genitourinary: Negative for hematuria and difficulty urinating.  Musculoskeletal: Negative for back pain.  Skin: Negative for rash.  Neurological: Positive for weakness (general). Negative for syncope.      Allergies  Amoxicillin and Lisinopril  Home Medications   Current Outpatient Rx  Name  Route  Sig  Dispense  Refill  . albuterol (PROVENTIL HFA;VENTOLIN HFA) 108 (90 BASE) MCG/ACT inhaler   Inhalation   Inhale 2 puffs into the lungs every  6 (six) hours as needed. For shortness of breath.         Marland Kitchen. amitriptyline (ELAVIL) 50 MG tablet   Oral   Take 50 mg by mouth at bedtime.           Marland Kitchen. aspirin 81 MG tablet   Oral   Take 81 mg by mouth daily.          Marland Kitchen. atorvastatin (LIPITOR) 20 MG tablet   Oral   Take 20 mg by mouth daily at 6 PM.          . betamethasone valerate (VALISONE) 0.1 % cream   Topical   Apply 1 application topically as needed (bra burns).          . diphenhydrAMINE (BENADRYL) 25 MG tablet   Oral   Take 25 mg by mouth at bedtime as needed (watery eyes).         . ferrous sulfate 325 (65 FE) MG  tablet   Oral   Take 1 tablet (325 mg total) by mouth daily.   30 tablet   1   . fluticasone-salmeterol (ADVAIR HFA) 230-21 MCG/ACT inhaler   Inhalation   Inhale 1 puff into the lungs 2 (two) times daily.         . furosemide (LASIX) 20 MG tablet   Oral   Take 20 mg by mouth daily.         Marland Kitchen. loratadine (CLARITIN) 10 MG tablet   Oral   Take 10 mg by mouth daily.         Marland Kitchen. losartan (COZAAR) 25 MG tablet   Oral   Take 25 mg by mouth daily.          . metFORMIN (GLUCOPHAGE) 500 MG tablet   Oral   Take 500 mg by mouth 2 (two) times daily with a meal.         . Multiple Vitamins-Minerals (CENTRUM ADULTS PO)   Oral   Take 1 tablet by mouth daily.         Marland Kitchen. omeprazole (PRILOSEC) 20 MG capsule   Oral   Take 20 mg by mouth daily.          . predniSONE (DELTASONE) 20 MG tablet   Oral   Take 20 mg by mouth See admin instructions. Taper as directed: Take 60 mg on first day, then 40 mg for four days thereafter.         . tiotropium (SPIRIVA) 18 MCG inhalation capsule   Inhalation   Place 18 mcg into inhaler and inhale daily.         . verapamil (CALAN-SR) 240 MG CR tablet   Oral   Take 1 tablet (240 mg total) by mouth daily.   30 tablet   5     THIS IS A NEW DOSE   . nitroGLYCERIN (NITROSTAT) 0.4 MG SL tablet   Sublingual   Place 0.8 mg under the tongue every 5 (five) minutes as needed. For chest pain.          BP 141/51  Pulse 91  Temp(Src) 98 F (36.7 C) (Oral)  Resp 16  Ht 5\' 4"  (1.626 m)  Wt 182 lb (82.555 kg)  BMI 31.22 kg/m2  SpO2 97% Physical Exam  Constitutional: She is oriented to person, place, and time. She appears well-developed and well-nourished. No distress.  Pt in NAD  HENT:  Head: Normocephalic and atraumatic.  Mouth/Throat: Oropharynx is clear and moist.  Eyes: Conjunctivae and EOM  are normal. Pupils are equal, round, and reactive to light. Right eye exhibits no discharge. Left eye exhibits no discharge. No scleral icterus.   Neck: Normal range of motion. Neck supple.  Cardiovascular: Normal rate, regular rhythm and intact distal pulses.  Exam reveals no gallop and no friction rub.   No murmur heard. Pulmonary/Chest: She is in respiratory distress (slightly tachypnic). She has wheezes (b/l expiratory wheeze). She has no rales.  Abdominal: Soft. She exhibits no distension and no mass. There is no tenderness.  Musculoskeletal: Normal range of motion.  Neurological: She is alert and oriented to person, place, and time. No cranial nerve deficit. She exhibits normal muscle tone. Coordination normal.  Skin: She is not diaphoretic.    ED Course  Procedures (including critical care time) Labs Review Labs Reviewed  CBC WITH DIFFERENTIAL - Abnormal; Notable for the following:    WBC 15.1 (*)    RBC 3.18 (*)    Hemoglobin 8.2 (*)    HCT 28.7 (*)    MCH 25.8 (*)    MCHC 28.6 (*)    RDW 15.7 (*)    Neutrophils Relative % 79 (*)    Neutro Abs 11.9 (*)    Monocytes Absolute 1.1 (*)    All other components within normal limits  COMPREHENSIVE METABOLIC PANEL - Abnormal; Notable for the following:    Albumin 3.3 (*)    GFR calc non Af Amer 79 (*)    All other components within normal limits  PRO B NATRIURETIC PEPTIDE - Abnormal; Notable for the following:    Pro B Natriuretic peptide (BNP) 1042.0 (*)    All other components within normal limits  CBC - Abnormal; Notable for the following:    WBC 13.7 (*)    RBC 3.10 (*)    Hemoglobin 8.1 (*)    HCT 27.9 (*)    MCHC 29.0 (*)    RDW 15.8 (*)    All other components within normal limits  CREATININE, SERUM - Abnormal; Notable for the following:    GFR calc non Af Amer 81 (*)    All other components within normal limits  CBG MONITORING, ED - Abnormal; Notable for the following:    Glucose-Capillary 135 (*)    All other components within normal limits  HEMOGLOBIN A1C  IRON AND TIBC  I-STAT TROPOININ, ED   Imaging Review Dg Chest 2 View  04/04/2013   CLINICAL  DATA:  COPD, asthma attack  EXAM: CHEST  2 VIEW  COMPARISON:  04/02/2013  FINDINGS: Cardiac shadow is stable. The lungs are again hyperinflated consistent with the given clinical history. Mild interstitial changes are noted. Slight increased density is noted in the bases bilaterally consistent with mild atelectasis. No acute bony abnormality is seen.  IMPRESSION: Mild bibasilar atelectasis.   Electronically Signed   By: Alcide Clever M.D.   On: 04/04/2013 09:31     EKG Interpretation   Date/Time:  Thursday April 04 2013 08:09:57 EDT Ventricular Rate:  83 PR Interval:  128 QRS Duration: 94 QT Interval:  406 QTC Calculation: 477 R Axis:   61 Text Interpretation:  Sinus rhythm Anterior infarct, old Borderline  repolarization abnormality Confirmed by WARD,  DO, KRISTEN ([id number]) on  04/04/2013 8:27:47 AM      MDM   MDM: 80 y.o. WF w/ PMHx of Parox A fib, COPD, asthma, HLD, anemia in past w/ 4 days of SOB. Pt states she has been generalized weak, SOB for 4 days. Only on O2  at night. Seen in ED 2 days ago and discharged for mild COPD exacerbation. States has gotten worse, using nebs more often. Denies chest pain. No BRBPR or melena. Hb 8s on Tuesday, but has been low before. EMS picked up today and had 2 L [place] on and was 86%. Given neb and improved. Here feels better, still mildly tachypnic. Wheezing. No signs or sxs of DVT, no risk factors outside of age. HDS. CXR obtained, atelectasis no obv PNA. Leukocytosis present, likely from from previous steroids. Anemia stable. Concern for symptomatic anemia vs COPD exacerbation vs anginal equivalent. Trop negative. EKG unchanged. Will admit to medicine. Care of case d/w my attending.  Final diagnoses:  COPD exacerbation  Bronchitis, acute  CAP (community acquired pneumonia)  DM (diabetes mellitus)  GERD (gastroesophageal reflux disease)  HTN (hypertension)    Admit to Medicine   Pilar Jarvis, MD 04/04/13 [phone]

## 2013-04-04 NOTE — Progress Notes (Signed)
Pt admitted for COPD exac, pt a/o, no c/o pain, pt on 2L, pt oriented to unit, call bell in place, safety maintained, pt stable

## 2013-04-05 ENCOUNTER — Inpatient Hospital Stay (HOSPITAL_COMMUNITY): Payer: Medicare Other

## 2013-04-05 DIAGNOSIS — I4891 Unspecified atrial fibrillation: Secondary | ICD-10-CM

## 2013-04-05 DIAGNOSIS — J449 Chronic obstructive pulmonary disease, unspecified: Secondary | ICD-10-CM

## 2013-04-05 DIAGNOSIS — Z8719 Personal history of other diseases of the digestive system: Secondary | ICD-10-CM

## 2013-04-05 LAB — COMPREHENSIVE METABOLIC PANEL
ALT: 18 U/L (ref 0–35)
AST: 13 U/L (ref 0–37)
Albumin: 3 g/dL — ABNORMAL LOW (ref 3.5–5.2)
Alkaline Phosphatase: 70 U/L (ref 39–117)
BUN: 17 mg/dL (ref 6–23)
CALCIUM: 8.6 mg/dL (ref 8.4–10.5)
CO2: 33 meq/L — AB (ref 19–32)
CREATININE: 0.59 mg/dL (ref 0.50–1.10)
Chloride: 103 mEq/L (ref 96–112)
GFR calc Af Amer: >90 mL/min (ref >90)
GFR calc non Af Amer: 84 mL/min — ABNORMAL LOW (ref >90)
Glucose, Bld: 163 mg/dL — ABNORMAL HIGH (ref 70–99)
Potassium: 4.8 mEq/L (ref 3.7–5.3)
SODIUM: 144 meq/L (ref 137–147)
TOTAL PROTEIN: 5.7 g/dL — AB (ref 6.0–8.3)
Total Bilirubin: 0.3 mg/dL (ref 0.3–1.2)

## 2013-04-05 LAB — GLUCOSE, CAPILLARY
Glucose-Capillary: 150 mg/dL — ABNORMAL HIGH (ref 70–99)
Glucose-Capillary: 199 mg/dL — ABNORMAL HIGH (ref 70–99)
Glucose-Capillary: 277 mg/dL — ABNORMAL HIGH (ref 70–99)
Glucose-Capillary: 185 mg/dL — ABNORMAL HIGH (ref 70–99)

## 2013-04-05 NOTE — Progress Notes (Signed)
[name]                                                                              Patient Demographics  [name], is a 80 y.o. female, DOB - 09-12-1929, ZOX:[id number]  Admit date - 04/04/2013   Admitting Physician Jeralyn Bennett, MD  Outpatient Primary MD for the patient is Hoyle Sauer, MD  LOS - 1   Chief Complaint  Patient presents with  . Shortness of Breath        Assessment & Plan   Possible COPD exacerbation -Patient has had increasing cough or shortness of breath and has a history of COPD -Chest x-ray shows hyperinflated lungs -Continue Solu-Medrol, DuoNeb treatments, and empiric IV antibiotic coverage, Dulera  Possible community-acquired pneumonia -May have precipitated her COPD exacerbation -Continue IV azithromycin and ceftriaxone -Repeat chest x-ray pending  Acute on chronic anemia -Hemoglobin currently 8.1 -Patient does have history of GI bleed in 2013 -She does she's had some bright red blood prolactin however has not been recent last few days, report occasional dark stool however admits to taking iron supplementation -Occult currently pending -Continue Protonix 40mg  PO twice a day -Gastroenterology consulted  Paroxysmal atrial fibrillation -Not on anticoagulation due to history of GI bleed -Currently rate and rhythm control  Hypertension -Continue Cozaar and verapamil  Diabetes mellitus type 2 -Metformin held -Hemoglobin A1c 5.8 -Continue insulin sliding scale with CBG monitoring  Hyperlipidemia Continue atorvastatin  Code Status: Full  Family Communication: Daughter at bedside.  Disposition Plan: Admitted  Time Spent in minutes   35 minutes  Procedures  None  Consults   Gastroenterology  DVT Prophylaxis  SCDs  Lab Results  Component Value Date   PLT 317 04/04/2013    Medications  Scheduled Meds: . amitriptyline  50 mg Oral QHS  . atorvastatin  20 mg Oral q1800  . azithromycin  500 mg Intravenous  Q24H  . cefTRIAXone (ROCEPHIN)  IV  1 g Intravenous Q24H  . furosemide  20 mg Oral Daily  . insulin aspart  0-15 Units Subcutaneous TID WC  . insulin aspart  0-5 Units Subcutaneous QHS  . ipratropium-albuterol  3 mL Nebulization Q4H  . losartan  25 mg Oral Daily  . methylPREDNISolone (SOLU-MEDROL) injection  60 mg Intravenous Q6H  . mometasone-formoterol  2 puff Inhalation BID  . pantoprazole  40 mg Oral BID WC  . sodium chloride  3 mL Intravenous Q12H  . sodium chloride  3 mL Intravenous Q12H  . verapamil  240 mg Oral Daily   Continuous Infusions:  PRN Meds:.sodium chloride, acetaminophen, acetaminophen, alum & mag hydroxide-simeth, benzonatate, nitroGLYCERIN, ondansetron (ZOFRAN) IV, ondansetron, oxyCODONE, sodium chloride  Antibiotics    Anti-infectives   Start     Dose/Rate Route Frequency Ordered Stop   04/04/13 1315  azithromycin (ZITHROMAX) 500 mg in dextrose 5 % 250 mL IVPB     500 mg 250 mL/hr over 60 Minutes Intravenous Every 24 hours 04/04/13 1309     04/04/13 1315  cefTRIAXone (ROCEPHIN) 1 g in dextrose 5 % 50 mL IVPB     1 g 100 mL/hr over 30 Minutes Intravenous Every 24 hours 04/04/13 1310  Subjective:   [name] seen and examined today.  Patient states her breathing has improved.  She is feeling better.  She has not had a bowel movement since being admitted.   Objective:   Filed Vitals:   04/05/13 0053 04/05/13 0057 04/05/13 0457 04/05/13 0515  BP: 171/60   146/59  Pulse: 85   81  Temp: 97.5 F (36.4 C)   97.9 F (36.6 C)  TempSrc: Oral   Oral  Resp: 20   20  Height:      Weight:    88.5 kg (195 lb 1.7 oz)  SpO2: 98% 98% 99% 98%    Wt Readings from Last 3 Encounters:  04/05/13 88.5 kg (195 lb 1.7 oz)  04/02/13 86.183 kg (190 lb)  01/22/13 86.183 kg (190 lb)     Intake/Output Summary (Last 24 hours) at 04/05/13 0805 Last data filed at 04/05/13 0753  Gross per 24 hour  Intake    590 ml  Output   1650 ml  Net  -1060 ml     Exam  General: Well developed, well nourished, NAD, appears stated age  HEENT: NCAT, PERRLA, EOMI, Anicteic Sclera, mucous membranes moist.   Neck: Supple, no JVD, no masses  Cardiovascular: S1 S2 auscultated, no rubs, murmurs or gallops. Regular rate and rhythm.  Respiratory: Diminished breath sounds, expiratory wheezing   Abdomen: Soft, nontender, nondistended, + bowel sounds  Extremities: warm dry without cyanosis clubbing or edema  Neuro: AAOx3, cranial nerves grossly intact. Strength 5/5 in patient's upper and lower extremities bilaterally  Skin: Without rashes exudates or nodules  Psych: Normal affect and demeanor with intact judgement and insight  Data Review   Micro Results No results found for this or any previous visit (from the past 240 hour(s)).  Radiology Reports Dg Chest 2 View  04/04/2013   CLINICAL DATA:  COPD, asthma attack  EXAM: CHEST  2 VIEW  COMPARISON:  04/02/2013  FINDINGS: Cardiac shadow is stable. The lungs are again hyperinflated consistent with the given clinical history. Mild interstitial changes are noted. Slight increased density is noted in the bases bilaterally consistent with mild atelectasis. No acute bony abnormality is seen.  IMPRESSION: Mild bibasilar atelectasis.   Electronically Signed   By: Alcide Clever M.D.   On: 04/04/2013 09:31   Dg Chest Port 1 View  04/02/2013   CLINICAL DATA:  Shortness of breath.  EXAM: PORTABLE CHEST - 1 VIEW  COMPARISON:  08/28/2012  FINDINGS: Heart size and pulmonary vascularity are normal and the lungs are clear except for slight scarring at the left lung base. The lungs are slightly hyperinflated consistent with the history of COPD. No acute osseous abnormality.  IMPRESSION: No acute abnormality.  Emphysema.   Electronically Signed   By: Geanie Cooley M.D.   On: 04/02/2013 10:01    CBC  Recent Labs Lab 04/02/13 0947 04/04/13 0830 04/04/13 1310  WBC 7.4 15.1* 13.7*  HGB 8.4* 8.2* 8.1*  HCT 29.1* 28.7*  27.9*  PLT 276 334 317  MCV 89.8 90.3 90.0  MCH 25.9* 25.8* 26.1  MCHC 28.9* 28.6* 29.0*  RDW 15.3 15.7* 15.8*  LYMPHSABS 1.7 2.1  --   MONOABS 0.7 1.1*  --   EOSABS 0.1 0.0  --   BASOSABS 0.0 0.0  --     Chemistries   Recent Labs Lab 04/02/13 0947 04/04/13 0830 04/04/13 1310 04/05/13 0438  NA 145 146  --  144  K 3.7 3.9  --  4.8  CL 104 106  --  103  CO2 29 31  --  33*  GLUCOSE 100* 93  --  163*  BUN 12 17  --  17  CREATININE 0.69 0.70 0.66 0.59  CALCIUM 8.7 8.7  --  8.6  AST  --  22  --  13  ALT  --  21  --  18  ALKPHOS  --  71  --  70  BILITOT  --  0.3  --  0.3   ------------------------------------------------------------------------------------------------------------------ estimated creatinine clearance is 60 ml/min (by C-G formula based on Cr of 0.59). ------------------------------------------------------------------------------------------------------------------  Recent Labs  04/04/13 1310  HGBA1C 5.8*   ------------------------------------------------------------------------------------------------------------------ No results found for this basename: CHOL, HDL, LDLCALC, TRIG, CHOLHDL, LDLDIRECT,  in the last 72 hours ------------------------------------------------------------------------------------------------------------------ No results found for this basename: TSH, T4TOTAL, FREET3, T3FREE, THYROIDAB,  in the last 72 hours ------------------------------------------------------------------------------------------------------------------  Recent Labs  04/04/13 1310  TIBC 410  IRON 13*    Coagulation profile No results found for this basename: INR, PROTIME,  in the last 168 hours  No results found for this basename: DDIMER,  in the last 72 hours  Cardiac Enzymes  Recent Labs Lab 04/02/13 0947  TROPONINI <0.30   ------------------------------------------------------------------------------------------------------------------ No  components found with this basename: POCBNP,     Sorren Vallier D.O. on 04/05/2013 at 8:05 AM  Between 7am to 7pm - Pager - [phone]  After 7pm go to [email or web address] - password [email or web address]  And look for the night coverage person covering for me after hours  [name] Office  [phone]

## 2013-04-05 NOTE — Consult Note (Signed)
Subjective:   HPI  The patient is an 80 year old female who was admitted to the hospital with increasing shortness of breath. We are asked to see her in regards to anemia. The patient states that she has been increasingly short of breath over the past month and she has been feeling weak she was thinking that she was anemic because she has been before. Her hemoglobin was 8.2 on admission. The patient states that she has had bleeding from the GI tract in the past but tells me she hasn't seen any blood in the year. Her stools are intermittently black but she does take iron. She has not seen any bright red blood per rectum for a year. She sees Dr. Randa EvensEdwards in our office as her primary gastroenterologist. She had a colonoscopy in 2009 to the cecum which showed only diverticulosis. A flexible sigmoidoscopy in December 2013 showed diverticulosis. She had an ERCP in October 2014 with removal of common bile duct stones. No upper GI lesions were mentioned.  Review of Systems Increasing shortness of breath  Past Medical History  Diagnosis Date  . Hypertension   . COPD (chronic obstructive pulmonary disease)   . Aortic insufficiency     Mild to moderate per echo in Jan 2012  . Dizziness   . PAF (paroxysmal atrial fibrillation)   . History of GI bleed   . Anemia   . DOE (dyspnea on exertion)   . Chest pain     Minimal disease per cath in 2010  . Mild aortic stenosis     Per echo in Jan 2012  . Asthma    Past Surgical History  Procedure Laterality Date  . Cardiac catheterization  03/10/2008    EF 65%; Minimal luminal irregularities with no obstructive lesions  . Cholecystectomy    . Hernia repair    . Transthoracic echocardiogram  02/05/2010    EF 55-65%; Mild AS, mild to moderate AI  . Cardiovascular stress test  02/29/2008    EF 60%  . Ercp N/A 08/31/2012    Procedure: ENDOSCOPIC RETROGRADE CHOLANGIOPANCREATOGRAPHY (ERCP);  Surgeon: Willis ModenaWilliam Outlaw, MD;  [facility];  Service: Endoscopy;   Laterality: N/A;   History   Social History  . Marital Status: Divorced    Spouse Name: N/A    Number of Children: N/A  . Years of Education: N/A   Occupational History  . Not on file.   Social History Main Topics  . Smoking status: Former Smoker    Quit date: 01/11/2008  . Smokeless tobacco: Not on file  . Alcohol Use: No  . Drug Use: No  . Sexual Activity: No   Other Topics Concern  . Not on file   Social History Narrative  . No narrative on file   family history includes Heart attack in her father. Current facility-administered medications:0.9 %  sodium chloride infusion, 250 mL, Intravenous, PRN, Jeralyn BennettEzequiel Zamora, MD;  acetaminophen (TYLENOL) suppository 650 mg, 650 mg, Rectal, Q6H PRN, Jeralyn BennettEzequiel Zamora, MD;  acetaminophen (TYLENOL) tablet 650 mg, 650 mg, Oral, Q6H PRN, Jeralyn BennettEzequiel Zamora, MD;  alum & mag hydroxide-simeth (MAALOX/MYLANTA) 200-200-20 MG/5ML suspension 30 mL, 30 mL, Oral, Q6H PRN, Jeralyn BennettEzequiel Zamora, MD amitriptyline (ELAVIL) tablet 50 mg, 50 mg, Oral, QHS, Jeralyn BennettEzequiel Zamora, MD, 50 mg at 04/04/13 2216;  atorvastatin (LIPITOR) tablet 20 mg, 20 mg, Oral, q1800, Jeralyn BennettEzequiel Zamora, MD, 20 mg at 04/04/13 1808;  azithromycin (ZITHROMAX) 500 mg in dextrose 5 % 250 mL IVPB, 500 mg, Intravenous, Q24H, Jeralyn BennettEzequiel Zamora, MD, 500  mg at 04/04/13 1421;  benzonatate (TESSALON) capsule 100 mg, 100 mg, Oral, BID PRN, Jeralyn Bennett, MD cefTRIAXone (ROCEPHIN) 1 g in dextrose 5 % 50 mL IVPB, 1 g, Intravenous, Q24H, Jeralyn Bennett, MD, 1 g at 04/04/13 1331;  furosemide (LASIX) tablet 20 mg, 20 mg, Oral, Daily, Jeralyn Bennett, MD, 20 mg at 04/05/13 1004;  insulin aspart (novoLOG) injection 0-15 Units, 0-15 Units, Subcutaneous, TID WC, Jeralyn Bennett, MD, 3 Units at 04/05/13 1209;  insulin aspart (novoLOG) injection 0-5 Units, 0-5 Units, Subcutaneous, QHS, Jeralyn Bennett, MD ipratropium-albuterol (DUONEB) 0.5-2.5 (3) MG/3ML nebulizer solution 3 mL, 3 mL, Nebulization, Q4H, Jeralyn Bennett, MD, 3  mL at 04/05/13 1147;  losartan (COZAAR) tablet 25 mg, 25 mg, Oral, Daily, Jeralyn Bennett, MD, 25 mg at 04/05/13 1004;  methylPREDNISolone sodium succinate (SOLU-MEDROL) 125 mg/2 mL injection 60 mg, 60 mg, Intravenous, Q6H, Jeralyn Bennett, MD, 60 mg at 04/05/13 0553 mometasone-formoterol (DULERA) 200-5 MCG/ACT inhaler 2 puff, 2 puff, Inhalation, BID, Jeralyn Bennett, MD, 2 puff at 04/05/13 0906;  nitroGLYCERIN (NITROSTAT) SL tablet 0.4 mg, 0.4 mg, Sublingual, Q5 min PRN, Jeralyn Bennett, MD;  ondansetron (ZOFRAN) injection 4 mg, 4 mg, Intravenous, Q6H PRN, Jeralyn Bennett, MD;  ondansetron (ZOFRAN) tablet 4 mg, 4 mg, Oral, Q6H PRN, Jeralyn Bennett, MD oxyCODONE (Oxy IR/ROXICODONE) immediate release tablet 5 mg, 5 mg, Oral, Q4H PRN, Jeralyn Bennett, MD;  pantoprazole (PROTONIX) EC tablet 40 mg, 40 mg, Oral, BID WC, Jeralyn Bennett, MD, 40 mg at 04/05/13 0754;  sodium chloride 0.9 % injection 3 mL, 3 mL, Intravenous, Q12H, Jeralyn Bennett, MD, 3 mL at 04/05/13 1005;  sodium chloride 0.9 % injection 3 mL, 3 mL, Intravenous, PRN, Jeralyn Bennett, MD verapamil (CALAN-SR) CR tablet 240 mg, 240 mg, Oral, Daily, Jeralyn Bennett, MD, 240 mg at 04/05/13 1004 Allergies  Allergen Reactions  . Amoxicillin Diarrhea  . Lisinopril Swelling    Swelling of tongue     Objective:     BP 143/49  Pulse 73  Temp(Src) 98 F (36.7 C) (Oral)  Resp 20  Ht 5' 4.5" (1.638 m)  Wt 88.5 kg (195 lb 1.7 oz)  BMI 32.98 kg/m2  SpO2 96%  She appears short of breath  Nonicteric  Heart regular rhythm  Lungs bilateral rhonchi  Abdomen: All sounds normal, soft, nontender  Laboratory No components found with this basename: d1      Assessment:     #1. Anemia. Iron studies are consistent with iron deficiency.  #2. Diverticulosis  #3. COPD      Plan:     The case was discussed earlier today with a primary care physician. We're going to check stool to see if it is heme positive or not. Further decisions can be  made after that as far as any further evaluation. I doubt she needs another colonoscopy. Perhaps an EGD if stool is heme positive. Perhaps capsule endoscopy if EGD is negative, and stool is heme positive. Lab Results  Component Value Date   HGB 8.1* 04/04/2013   HGB 8.2* 04/04/2013   HGB 8.4* 04/02/2013   HCT 27.9* 04/04/2013   HCT 28.7* 04/04/2013   HCT 29.1* 04/02/2013   ALKPHOS 70 04/05/2013   ALKPHOS 71 04/04/2013   ALKPHOS 120* 09/01/2012   AST 13 04/05/2013   AST 22 04/04/2013   AST 14 09/01/2012   ALT 18 04/05/2013   ALT 21 04/04/2013   ALT 82* 09/01/2012

## 2013-04-05 NOTE — Progress Notes (Signed)
Patient is A/O x 4.  Non productive cough with expiratory wheezing.   Pt is on 2 liter of O2, which she wears at home.  IV steroids.  Pt is stable.

## 2013-04-05 NOTE — Progress Notes (Signed)
UR completed Abhishek Levesque K. Donalda Job, RN, BSN, MSHL, CCM  04/05/2013 2:06 PM

## 2013-04-05 NOTE — Care Management Note (Addendum)
    Page 1 of 1   04/08/2013     1:54:47 PM   CARE MANAGEMENT NOTE 04/08/2013  Patient:  [name]   Account Number:  [id number]  Date Initiated:  04/05/2013  Documentation initiated by:  HUTCHINSON,CRYSTAL  Subjective/Objective Assessment:   Admitted with COPD exacerbation, PNE, Hx/o GI Bleed     Action/Plan:   CM to follow for disposition needs   Anticipated DC Date:  04/08/2013   Anticipated DC Plan:  HOME/SELF CARE         Choice offered to / List presented to:             Status of service:  Completed, signed off Medicare Important Message given?   (If response is "NO", the following Medicare IM given date fields will be blank) Date Medicare IM given:   Date Additional Medicare IM given:    Discharge Disposition:  HOME/SELF CARE  Per UR Regulation:  Reviewed for med. necessity/level of care/duration of stay  If discussed at Long Length of Stay Meetings, dates discussed:    Comments:  04/05/2013 IOS:  Nebs and IV Solumedrol Crystal Hutchinson RN, BSN, MSHL, CCM 04/05/2013

## 2013-04-05 NOTE — Progress Notes (Signed)
I co-sign Leslie Kellam SN notes and assessments 

## 2013-04-06 LAB — BASIC METABOLIC PANEL
BUN: 23 mg/dL (ref 6–23)
CALCIUM: 8.8 mg/dL (ref 8.4–10.5)
CO2: 30 meq/L (ref 19–32)
CREATININE: 0.7 mg/dL (ref 0.50–1.10)
Chloride: 98 mEq/L (ref 96–112)
GFR calc Af Amer: >90 mL/min (ref >90)
GFR calc non Af Amer: 79 mL/min — ABNORMAL LOW (ref >90)
GLUCOSE: 213 mg/dL — AB (ref 70–99)
Potassium: 4.7 mEq/L (ref 3.7–5.3)
Sodium: 141 mEq/L (ref 137–147)

## 2013-04-06 LAB — GLUCOSE, CAPILLARY
GLUCOSE-CAPILLARY: 172 mg/dL — AB (ref 70–99)
GLUCOSE-CAPILLARY: 160 mg/dL — AB (ref 70–99)
GLUCOSE-CAPILLARY: 284 mg/dL — AB (ref 70–99)
GLUCOSE-CAPILLARY: 195 mg/dL — AB (ref 70–99)

## 2013-04-06 LAB — CBC
HCT: 27.9 % — ABNORMAL LOW (ref 36.0–46.0)
HEMOGLOBIN: 8.1 g/dL — AB (ref 12.0–15.0)
MCH: 26 pg (ref 26.0–34.0)
MCHC: 29 g/dL — ABNORMAL LOW (ref 30.0–36.0)
MCV: 89.4 fL (ref 78.0–100.0)
Platelets: 340 10*3/uL (ref 150–400)
RBC: 3.12 MIL/uL — ABNORMAL LOW (ref 3.87–5.11)
RDW: 15.9 % — ABNORMAL HIGH (ref 11.5–15.5)
WBC: 11.2 10*3/uL — ABNORMAL HIGH (ref 4.0–10.5)

## 2013-04-06 LAB — PREPARE RBC (CROSSMATCH)

## 2013-04-06 LAB — OCCULT BLOOD X 1 CARD TO LAB, STOOL: Fecal Occult Bld: POSITIVE — AB

## 2013-04-06 MED ORDER — DOCUSATE SODIUM 100 MG PO CAPS
100.0000 mg | ORAL_CAPSULE | Freq: Two times a day (BID) | ORAL | Status: DC | PRN
  Filled 2013-04-06: qty 1

## 2013-04-06 MED ORDER — POLYETHYLENE GLYCOL 3350 17 G PO PACK
17.0000 g | PACK | Freq: Every day | ORAL | Status: DC
  Administered 2013-04-06 – 2013-04-07 (×2): 17 g via ORAL
  Filled 2013-04-06 (×3): qty 1

## 2013-04-06 MED ORDER — GUAIFENESIN ER 600 MG PO TB12
1200.0000 mg | ORAL_TABLET | Freq: Two times a day (BID) | ORAL | Status: DC
  Administered 2013-04-06 – 2013-04-07 (×3): 1200 mg via ORAL
  Filled 2013-04-06 (×4): qty 2

## 2013-04-06 MED ORDER — IPRATROPIUM-ALBUTEROL 0.5-2.5 (3) MG/3ML IN SOLN
3.0000 mL | Freq: Four times a day (QID) | RESPIRATORY_TRACT | Status: DC
  Administered 2013-04-07 (×3): 3 mL via RESPIRATORY_TRACT
  Filled 2013-04-06 (×3): qty 3

## 2013-04-06 NOTE — Progress Notes (Addendum)
Alert and oriented.  Ulna boot to left lower leg intact.  Over bed trapeze in place.    1900 Pressure dressing to first stick site left wrist.  Warm compress to left upper arm.  1308 Finished first cup of contrast for CT.  1401  Finisihed second cup of contrast.

## 2013-04-06 NOTE — Evaluation (Signed)
Physical Therapy Evaluation Patient Details Name: [name] MRN: [id number] DOB: [birth year] Today's Date: 04/06/2013   History of Present Illness  admitted with progressive SOB due to COPD exac vs PNA.  Also evaluating anemia.  Clinical Impression  Pt admitted with/for above problem.  Pt currently limited functionally due to the problems listed below.  (see problems list.)  Pt will benefit from PT to maximize function and safety to be able to get home safely with available assist of family.      Follow Up Recommendations No PT follow up;Supervision for mobility/OOB    Equipment Recommendations  None recommended by PT    Recommendations for Other Services       Precautions / Restrictions Precautions Precautions: Fall      Mobility  Bed Mobility Overal bed mobility: Modified Independent                Transfers Overall transfer level: Modified independent                  Ambulation/Gait Ambulation/Gait assistance: Min assist Ambulation Distance (Feet): 120 Feet (with 1 standing rest break and notable dyspnea) Assistive device:  (pushing IV pole and HHA) Gait Pattern/deviations: Step-through pattern   Gait velocity interpretation: Below normal speed for age/gender General Gait Details: generally steady, but mildly guarded  Stairs            Wheelchair Mobility    Modified Rankin (Stroke Patients Only)       Balance Overall balance assessment: Needs assistance Sitting-balance support: Feet supported Sitting balance-Leahy Scale: Normal     Standing balance support: Single extremity supported;No upper extremity supported Standing balance-Leahy Scale: Fair                       Pertinent Vitals/Pain sats on 3 L [place] during gait at 96% and EHR 90 bpm, with noticeable dyspnea    Home Living Family/patient expects to be discharged to:: Private residence Living Arrangements: Children Available Help at Discharge: Family Type of  Home: House Home Access: Stairs to enter Entrance Stairs-Rails: Doctor, general practice of Steps: 3 Home Layout: One level Home Equipment: None      Prior Function Level of Independence: Independent               Hand Dominance        Extremity/Trunk Assessment   Upper Extremity Assessment: Overall WFL for tasks assessed           Lower Extremity Assessment: Overall WFL for tasks assessed;Generalized weakness         Communication   Communication: No difficulties  Cognition Arousal/Alertness: Awake/alert Behavior During Therapy: WFL for tasks assessed/performed Overall Cognitive Status: Within Functional Limits for tasks assessed                      General Comments      Exercises        Assessment/Plan    PT Assessment Patient needs continued PT services  PT Diagnosis Other (comment);Generalized weakness (decr activity tolerance)   PT Problem List Decreased activity tolerance;Decreased mobility;Decreased balance;Decreased strength  PT Treatment Interventions Gait training;Functional mobility training;Therapeutic activities;Patient/family education;Balance training   PT Goals (Current goals can be found in the Care Plan section) Acute Rehab PT Goals Patient Stated Goal: home soon without oxygen PT Goal Formulation: With patient Time For Goal Achievement: 04/09/13 Potential to Achieve Goals: Good    Frequency Min 2X/week   Barriers to  discharge        End of Session Equipment Utilized During Treatment: Oxygen Activity Tolerance: Patient tolerated treatment well Patient left: Other (comment) (sitting EOB)         Time: [id number] PT Time Calculation (min): 21 min   Charges:   PT Evaluation $Initial PT Evaluation Tier I: 1 Procedure PT Treatments $Gait Training: 8-22 mins   PT G Codes:          [place], Eliseo GumKenneth V 04/06/2013, 5:32 PM 04/06/2013  [place] BingKen Lauramae Kneisley, PT [phone]  (pager)

## 2013-04-06 NOTE — Plan of Care (Signed)
Problem: Consults Goal: COPD Patient Education (See Patient Education Module for education specifics.)  Outcome: Progressing Encoruaged not to smoke and limit time that she is exposed to other smokers.

## 2013-04-06 NOTE — Progress Notes (Signed)
[name]                                                                              Patient Demographics  [name], is a 80 y.o. female, DOB - 02-May-1929, ZOX:[id number]  Admit date - 04/04/2013   Admitting Physician Jeralyn Bennett, MD  Outpatient Primary MD for the patient is Hoyle Sauer, MD  LOS - 2   Chief Complaint  Patient presents with  . Shortness of Breath        Assessment & Plan   Possible COPD exacerbation -Patient has had increasing cough or shortness of breath and has a history of COPD -Chest x-ray shows COPD changes and with mild bibasilar atelectasis noted, with some pulmonary vascular congestion, no definite infiltrates -Continue Solu-Medrol, DuoNeb treatments, and empiric IV antibiotic coverage with azithromycin and ceftriaxone, Dulera  Acute on chronic anemia -Hemoglobin currently 8.1 -Patient does have history of GI bleed in 2013 -She does she's had some bright red blood prolactin however has not been recent last few days, report occasional dark stool however admits to taking iron supplementation -Occult currently pending -Continue Protonix 40mg  PO twice a day -Gastroenterology consulted -Patient appears to be symptomatic, will to walk and be evaluated, may need one unit of blood  Paroxysmal atrial fibrillation -Not on anticoagulation due to history of GI bleed -Currently rate and rhythm control  Hypertension -Continue Cozaar and verapamil  Diabetes mellitus type 2 -Metformin held -Hemoglobin A1c 5.8 -Continue insulin sliding scale with CBG monitoring  Hyperlipidemia -Continue atorvastatin  Constipation -Will add MiraLAX as well as Colace  Code Status: Full  Family Communication: Daughter at bedside.  Disposition Plan: Admitted  Time Spent in minutes   25 minutes  Procedures  None  Consults   Gastroenterology  DVT Prophylaxis  SCDs  Lab Results  Component Value Date   PLT 340 04/06/2013     Medications  Scheduled Meds: . amitriptyline  50 mg Oral QHS  . atorvastatin  20 mg Oral q1800  . azithromycin  500 mg Intravenous Q24H  . cefTRIAXone (ROCEPHIN)  IV  1 g Intravenous Q24H  . furosemide  20 mg Oral Daily  . guaiFENesin  1,200 mg Oral BID  . insulin aspart  0-15 Units Subcutaneous TID WC  . insulin aspart  0-5 Units Subcutaneous QHS  . ipratropium-albuterol  3 mL Nebulization Q4H  . losartan  25 mg Oral Daily  . methylPREDNISolone (SOLU-MEDROL) injection  60 mg Intravenous Q6H  . mometasone-formoterol  2 puff Inhalation BID  . pantoprazole  40 mg Oral BID WC  . sodium chloride  3 mL Intravenous Q12H  . verapamil  240 mg Oral Daily   Continuous Infusions:  PRN Meds:.sodium chloride, acetaminophen, acetaminophen, alum & mag hydroxide-simeth, benzonatate, nitroGLYCERIN, ondansetron (ZOFRAN) IV, ondansetron, oxyCODONE, sodium chloride  Antibiotics    Anti-infectives   Start     Dose/Rate Route Frequency Ordered Stop   04/04/13 1315  azithromycin (ZITHROMAX) 500 mg in dextrose 5 % 250 mL IVPB     500 mg 250 mL/hr over 60 Minutes Intravenous Every 24 hours 04/04/13 1309     04/04/13 1315  cefTRIAXone (ROCEPHIN) 1 g in dextrose 5 % 50  mL IVPB     1 g 100 mL/hr over 30 Minutes Intravenous Every 24 hours 04/04/13 1310          Subjective:   [name] seen and examined today.  Patient continues to feel short of breath, particularly with movement. She does continue to cough as well. She complains of not being able to have a bowel movement.  Objective:   Filed Vitals:   04/05/13 2102 04/05/13 2243 04/06/13 0304 04/06/13 0602  BP: 94/44   172/47  Pulse: 84   88  Temp: 97.9 F (36.6 C)   98.3 F (36.8 C)  TempSrc: Oral   Oral  Resp: 18   17  Height:      Weight:    89.5 kg (197 lb 5 oz)  SpO2: 98% 97% 94% 98%    Wt Readings from Last 3 Encounters:  04/06/13 89.5 kg (197 lb 5 oz)  04/02/13 86.183 kg (190 lb)  01/22/13 86.183 kg (190 lb)      Intake/Output Summary (Last 24 hours) at 04/06/13 0819 Last data filed at 04/06/13 1610  Gross per 24 hour  Intake    820 ml  Output   2725 ml  Net  -1905 ml    Exam  General: Well developed, well nourished, NAD, appears stated age  HEENT: NCAT, PERRLA, EOMI, Anicteic Sclera, mucous membranes moist.   Neck: Supple, no JVD, no masses  Cardiovascular: S1 S2 auscultated, no rubs, murmurs or gallops. Regular rate and rhythm.  Respiratory: Diminished breath sounds, expiratory wheezing   Abdomen: Soft, nontender, nondistended, + bowel sounds  Extremities: warm dry without cyanosis clubbing or edema  Neuro: AAOx3, no focal deficits  Skin: Without rashes exudates or nodules  Psych: Normal affect and demeanor with intact judgement and insight  Data Review   Micro Results No results found for this or any previous visit (from the past 240 hour(s)).  Radiology Reports Dg Chest 2 View  04/04/2013   CLINICAL DATA:  COPD, asthma attack  EXAM: CHEST  2 VIEW  COMPARISON:  04/02/2013  FINDINGS: Cardiac shadow is stable. The lungs are again hyperinflated consistent with the given clinical history. Mild interstitial changes are noted. Slight increased density is noted in the bases bilaterally consistent with mild atelectasis. No acute bony abnormality is seen.  IMPRESSION: Mild bibasilar atelectasis.   Electronically Signed   By: Alcide Clever M.D.   On: 04/04/2013 09:31   Dg Chest Port 1 View  04/02/2013   CLINICAL DATA:  Shortness of breath.  EXAM: PORTABLE CHEST - 1 VIEW  COMPARISON:  08/28/2012  FINDINGS: Heart size and pulmonary vascularity are normal and the lungs are clear except for slight scarring at the left lung base. The lungs are slightly hyperinflated consistent with the history of COPD. No acute osseous abnormality.  IMPRESSION: No acute abnormality.  Emphysema.   Electronically Signed   By: Geanie Cooley M.D.   On: 04/02/2013 10:01    CBC  Recent Labs Lab  04/02/13 0947 04/04/13 0830 04/04/13 1310 04/06/13 0515  WBC 7.4 15.1* 13.7* 11.2*  HGB 8.4* 8.2* 8.1* 8.1*  HCT 29.1* 28.7* 27.9* 27.9*  PLT 276 334 317 340  MCV 89.8 90.3 90.0 89.4  MCH 25.9* 25.8* 26.1 26.0  MCHC 28.9* 28.6* 29.0* 29.0*  RDW 15.3 15.7* 15.8* 15.9*  LYMPHSABS 1.7 2.1  --   --   MONOABS 0.7 1.1*  --   --   EOSABS 0.1 0.0  --   --  BASOSABS 0.0 0.0  --   --     Chemistries   Recent Labs Lab 04/02/13 0947 04/04/13 0830 04/04/13 1310 04/05/13 0438 04/06/13 0515  NA 145 146  --  144 141  K 3.7 3.9  --  4.8 4.7  CL 104 106  --  103 98  CO2 29 31  --  33* 30  GLUCOSE 100* 93  --  163* 213*  BUN 12 17  --  17 23  CREATININE 0.69 0.70 0.66 0.59 0.70  CALCIUM 8.7 8.7  --  8.6 8.8  AST  --  22  --  13  --   ALT  --  21  --  18  --   ALKPHOS  --  71  --  70  --   BILITOT  --  0.3  --  0.3  --    ------------------------------------------------------------------------------------------------------------------ estimated creatinine clearance is 60.3 ml/min (by C-G formula based on Cr of 0.7). ------------------------------------------------------------------------------------------------------------------  Recent Labs  04/04/13 1310  HGBA1C 5.8*   ------------------------------------------------------------------------------------------------------------------ No results found for this basename: CHOL, HDL, LDLCALC, TRIG, CHOLHDL, LDLDIRECT,  in the last 72 hours ------------------------------------------------------------------------------------------------------------------ No results found for this basename: TSH, T4TOTAL, FREET3, T3FREE, THYROIDAB,  in the last 72 hours ------------------------------------------------------------------------------------------------------------------  Recent Labs  04/04/13 1310  TIBC 410  IRON 13*    Coagulation profile No results found for this basename: INR, PROTIME,  in the last 168 hours  No results found  for this basename: DDIMER,  in the last 72 hours  Cardiac Enzymes  Recent Labs Lab 04/02/13 0947  TROPONINI <0.30   ------------------------------------------------------------------------------------------------------------------ No components found with this basename: POCBNP,     Armanii Urbanik D.O. on 04/06/2013 at 8:19 AM  Between 7am to 7pm - Pager - [phone]  After 7pm go to [email or web address] - password [email or web address]  And look for the night coverage person covering for me after hours  [name] Office  [phone]

## 2013-04-06 NOTE — Progress Notes (Signed)
[name] 10:13 AM  Subjective: Patient without any obvious GI complaints and has a history of anemia and workup in the remote past and will occasionally sees some bright red blood on the toilet tissue from her hemorrhoids but she's not been on her iron at home in  a few months and is breathing may be a little better stool dark brown per nurse Objective: Vital signs stable afebrile no acute distress hemoglobin stable abdomen is soft nontender guaiac pending  Assessment: Multiple medical problems in patient with chronic anemia  Plan: Await guaiac would add a ferritin to assist with iron deficiency versus anemia of chronic disease and consider a CT of the abdominal pelvis with contrast first to rule out anything big or bad and then we can decide if any further GI workup is needed [facility],Miquel Stacks E

## 2013-04-07 ENCOUNTER — Inpatient Hospital Stay (HOSPITAL_COMMUNITY): Payer: Medicare Other

## 2013-04-07 LAB — TYPE AND SCREEN
ABO/RH(D): O POS
Antibody Screen: NEGATIVE
UNIT DIVISION: 0

## 2013-04-07 LAB — BASIC METABOLIC PANEL
BUN: 27 mg/dL — AB (ref 6–23)
CHLORIDE: 101 meq/L (ref 96–112)
CO2: 33 meq/L — AB (ref 19–32)
Calcium: 8.3 mg/dL — ABNORMAL LOW (ref 8.4–10.5)
Creatinine, Ser: 0.7 mg/dL (ref 0.50–1.10)
GFR calc Af Amer: >90 mL/min (ref >90)
GFR calc non Af Amer: 79 mL/min — ABNORMAL LOW (ref >90)
GLUCOSE: 189 mg/dL — AB (ref 70–99)
POTASSIUM: 4.5 meq/L (ref 3.7–5.3)
Sodium: 140 mEq/L (ref 137–147)

## 2013-04-07 LAB — CBC
HEMATOCRIT: 30.4 % — AB (ref 36.0–46.0)
Hemoglobin: 9.4 g/dL — ABNORMAL LOW (ref 12.0–15.0)
MCH: 27.6 pg (ref 26.0–34.0)
MCHC: 30.9 g/dL (ref 30.0–36.0)
MCV: 89.1 fL (ref 78.0–100.0)
Platelets: 309 10*3/uL (ref 150–400)
RBC: 3.41 MIL/uL — AB (ref 3.87–5.11)
RDW: 15.7 % — ABNORMAL HIGH (ref 11.5–15.5)
WBC: 10.6 10*3/uL — AB (ref 4.0–10.5)

## 2013-04-07 LAB — GLUCOSE, CAPILLARY
GLUCOSE-CAPILLARY: 216 mg/dL — AB (ref 70–99)
Glucose-Capillary: 158 mg/dL — ABNORMAL HIGH (ref 70–99)
Glucose-Capillary: 200 mg/dL — ABNORMAL HIGH (ref 70–99)

## 2013-04-07 LAB — FOLATE RBC: RBC Folate: 1334 ng/mL — ABNORMAL HIGH (ref >280)

## 2013-04-07 LAB — VITAMIN B12: Vitamin B-12: 278 pg/mL (ref 211–911)

## 2013-04-07 MED ORDER — AZITHROMYCIN 500 MG PO TABS
500.0000 mg | ORAL_TABLET | Freq: Every day | ORAL | Status: DC
  Administered 2013-04-07: 500 mg via ORAL
  Filled 2013-04-07: qty 1

## 2013-04-07 MED ORDER — IOHEXOL 300 MG/ML  SOLN
100.0000 mL | Freq: Once | INTRAMUSCULAR | Status: AC | PRN
  Administered 2013-04-07: 100 mL via INTRAVENOUS

## 2013-04-07 MED ORDER — PREDNISONE (PAK) 10 MG PO TABS: ORAL_TABLET | Freq: Every day | ORAL | Status: AC

## 2013-04-07 MED ORDER — IOHEXOL 300 MG/ML  SOLN: 25.0000 mL | INTRAMUSCULAR | Status: AC

## 2013-04-07 MED ORDER — AZITHROMYCIN 500 MG PO TABS: 500.0000 mg | ORAL_TABLET | Freq: Every day | ORAL | Status: AC

## 2013-04-07 MED ORDER — BENZONATATE 100 MG PO CAPS: 100.0000 mg | ORAL_CAPSULE | Freq: Two times a day (BID) | ORAL | Status: AC | PRN

## 2013-04-07 NOTE — Progress Notes (Signed)
[name] 11:28 AM  Subjective: Patient is doing better after her transfusion and has no GI complaint but was found to be guaiac positive and our previous GI workup was reviewed in our office computer and her case was discussed with the hospital team Objective: Vital signs stable afebrile no acute distress abdomen is soft nontender hemoglobin increased Assessment: Multiple medical problems including guaiac positive anemia  Plan: Will proceed with a CT today and if okay without significant finding okay to go home and followup as an outpatient with my partner Dr. Randa EvensEdwards to decide about further endoscopic studies and the hospital team will call me if something shows up significantly on the CT and then will proceed with appropriate study tomorrow   [name]

## 2013-04-07 NOTE — Discharge Summary (Signed)
Physician Discharge Summary  Sheri Keelnn R Rosenbach ZOX:[id number] DOB: 1929/09/26 DOA: 04/04/2013  PCP: Hoyle SauerAVVA,RAVISANKAR R, MD  Admit date: 04/04/2013 Discharge date: 04/07/2013  Time spent: 45 minutes  Recommendations for Outpatient Follow-up:  Patient will be discharged to home. She is to continue take medications as prescribed. Patient should follow primary care physician as well as Dr. Ramon DredgeEdward one week of discharge. She should have a CBC repeated within one discharge.  Discharge Diagnoses:  Principal Problem:   COPD exacerbation Active Problems:   HTN (hypertension)   COPD (chronic obstructive pulmonary disease)   Atrial fibrillation   CAP (community acquired pneumonia)   DM (diabetes mellitus)   Acute on chronic anemia   Hyperlipidemia  Discharge Condition: Stable  Diet recommendation: Carb modified  Filed Weights   04/05/13 0515 04/06/13 0602 04/07/13 0618  Weight: 88.5 kg (195 lb 1.7 oz) 89.5 kg (197 lb 5 oz) 90.4 kg (199 lb 4.7 oz)    History of present illness:  [name] is a 80 y.o. female with a past medical history of paroxysmal atrial fibrillation, not on anticoagulation, history GI bleed in April of 2013, chronic obstructive pulmonary disease, chronic hypoxemic respiratory failure, requiring 3 L of mental oxygen at baseline, history of tobacco abuse quitting 5 years ago, presenting to the emergency room with complaints of worsening cough and shortness of breath. She reports having progressively worsening shortness of breath over the past 2 weeks. She was seen in the emergency room on 04/02/2013 where she was discharged on a steroid taper. Despite this intervention symptoms continued to worsen. This morning her shortness of breath worsening, having difficulties tolerating physical exertion, unable to perform her usual activities. She complains of associated cough, having yellow sputum production. She denies sick contacts or recent travels, presently residing in the community.  She denies fevers, chills, chest pain, extremity edema, palpitations, syncope or presyncope. Initial lab work in the emergency room showed a hemoglobin of 8.2. She denies hematemesis or bright red blood per rectum however reports occasionally noticing dark stools however she is on iron therapy.    Hospital Course:  Possible COPD exacerbation  -Patient has had increasing cough or shortness of breath and has a history of COPD  -Chest x-ray shows COPD changes and with mild bibasilar atelectasis noted, with some pulmonary vascular congestion, no definite infiltrates  -Was initially placed on Solu-Medrol, DuoNeb treatments, and empiric IV antibiotic coverage with azithromycin and ceftriaxone, Dulera  -Will discharge with remainder of azithromycin and steroid taper  Acute on chronic anemia  -Hemoglobin currently 9.4 (after unit of PRBCs) -Patient does have history of GI bleed in 2013  -She does she's had some bright red blood prolactin however has not been recent last few days, report occasional dark stool however admits to taking iron supplementation  -Occult positive -Continue PPI -Gastroenterology consulted and recommended CT abdomen, which did not show any acute process -Patient was symptomatic (weak and very short of breath) and received 1u PRBCs on 3/28  Paroxysmal atrial fibrillation  -Not on anticoagulation due to history of GI bleed  -Currently rate and rhythm control   Hypertension  -Continue Cozaar and verapamil   Diabetes mellitus type 2  -Metformin held  -Hemoglobin A1c 5.8  -Continue insulin sliding scale with CBG monitoring   Hyperlipidemia  -Continue atorvastatin   Constipation  -Continue MiraLAX as well as Colace   Procedures: CT abdomen    Consultations: Gastroenterology   Discharge Exam: Filed Vitals:   04/07/13 1032  BP: 176/72  Pulse:   Temp:   Resp:    Exam  General: Well developed, well nourished, NAD, appears stated age  HEENT: NCAT, mucous  membranes moist.  Neck: Supple, no JVD, no masses  Cardiovascular: S1 S2 auscultated, no rubs, murmurs or gallops. Regular rate and rhythm.  Respiratory: Diminished breath sounds, expiratory wheezing improving Abdomen: Soft, nontender, nondistended, + bowel sounds  Extremities: warm dry without cyanosis clubbing or edema  Neuro: AAOx3, no focal deficits  Skin: Without rashes exudates or nodules  Psych: Normal affect and demeanor with intact judgement and insight  Discharge Instructions     Medication List    ASK your doctor about these medications       albuterol 108 (90 BASE) MCG/ACT inhaler  Commonly known as:  PROVENTIL HFA;VENTOLIN HFA  Inhale 2 puffs into the lungs every 6 (six) hours as needed. For shortness of breath.     amitriptyline 50 MG tablet  Commonly known as:  ELAVIL  Take 50 mg by mouth at bedtime.     aspirin 81 MG tablet  Take 81 mg by mouth daily.     atorvastatin 20 MG tablet  Commonly known as:  LIPITOR  Take 20 mg by mouth daily at 6 PM.     betamethasone valerate 0.1 % cream  Commonly known as:  VALISONE  Apply 1 application topically as needed (bra burns).     CENTRUM ADULTS PO  Take 1 tablet by mouth daily.     diphenhydrAMINE 25 MG tablet  Commonly known as:  BENADRYL  Take 25 mg by mouth at bedtime as needed (watery eyes).     ferrous sulfate 325 (65 FE) MG tablet  Take 1 tablet (325 mg total) by mouth daily.     fluticasone-salmeterol 230-21 MCG/ACT inhaler  Commonly known as:  ADVAIR HFA  Inhale 1 puff into the lungs 2 (two) times daily.     furosemide 20 MG tablet  Commonly known as:  LASIX  Take 20 mg by mouth daily.     loratadine 10 MG tablet  Commonly known as:  CLARITIN  Take 10 mg by mouth daily.     losartan 25 MG tablet  Commonly known as:  COZAAR  Take 25 mg by mouth daily.     metFORMIN 500 MG tablet  Commonly known as:  GLUCOPHAGE  Take 500 mg by mouth 2 (two) times daily with a meal.     nitroGLYCERIN 0.4 MG  SL tablet  Commonly known as:  NITROSTAT  Place 0.8 mg under the tongue every 5 (five) minutes as needed. For chest pain.     omeprazole 20 MG capsule  Commonly known as:  PRILOSEC  Take 20 mg by mouth daily.     ondansetron 4 MG tablet  Commonly known as:  ZOFRAN  Take 1 tablet (4 mg total) by mouth every 8 (eight) hours as needed for nausea.     predniSONE 20 MG tablet  Commonly known as:  DELTASONE  Take 20 mg by mouth See admin instructions. Taper as directed: Take 60 mg on first day, then 40 mg for four days thereafter.     tiotropium 18 MCG inhalation capsule  Commonly known as:  SPIRIVA  Place 18 mcg into inhaler and inhale daily.     verapamil 240 MG CR tablet  Commonly known as:  CALAN-SR  Take 1 tablet (240 mg total) by mouth daily.       Allergies  Allergen Reactions  . Amoxicillin Diarrhea  .  Lisinopril Swelling    Swelling of tongue      The results of significant diagnostics from this hospitalization (including imaging, microbiology, ancillary and laboratory) are listed below for reference.    Significant Diagnostic Studies: X-ray Chest Pa And Lateral   04/05/2013   CLINICAL DATA:  Shortness of breath, COPD, hypertension, asthma  EXAM: CHEST  2 VIEW  COMPARISON:  04/04/2013  FINDINGS: Enlargement of cardiac silhouette with pulmonary vascular congestion.  Chronic peribronchial thickening and accentuated interstitial markings, stable.  Bibasilar atelectasis and underlying emphysematous changes.  No acute infiltrate, pleural effusion or pneumothorax.  Diffuse osseous demineralization with scattered endplate spur formation thoracic spine.  IMPRESSION: Enlargement of cardiac silhouette with pulmonary vascular congestion.  COPD changes with chronic interstitial prominence and mild bibasilar atelectasis.   Electronically Signed   By: Ulyses Southward M.D.   On: 04/05/2013 08:13   Dg Chest 2 View  04/04/2013   CLINICAL DATA:  COPD, asthma attack  EXAM: CHEST  2 VIEW   COMPARISON:  04/02/2013  FINDINGS: Cardiac shadow is stable. The lungs are again hyperinflated consistent with the given clinical history. Mild interstitial changes are noted. Slight increased density is noted in the bases bilaterally consistent with mild atelectasis. No acute bony abnormality is seen.  IMPRESSION: Mild bibasilar atelectasis.   Electronically Signed   By: Alcide Clever M.D.   On: 04/04/2013 09:31   Dg Chest Port 1 View  04/02/2013   CLINICAL DATA:  Shortness of breath.  EXAM: PORTABLE CHEST - 1 VIEW  COMPARISON:  08/28/2012  FINDINGS: Heart size and pulmonary vascularity are normal and the lungs are clear except for slight scarring at the left lung base. The lungs are slightly hyperinflated consistent with the history of COPD. No acute osseous abnormality.  IMPRESSION: No acute abnormality.  Emphysema.   Electronically Signed   By: Geanie Cooley M.D.   On: 04/02/2013 10:01    Microbiology: No results found for this or any previous visit (from the past 240 hour(s)).   Labs: Basic Metabolic Panel:  Recent Labs Lab 04/02/13 0947 04/04/13 0830 04/04/13 1310 04/05/13 0438 04/06/13 0515 04/07/13 0550  NA 145 146  --  144 141 140  K 3.7 3.9  --  4.8 4.7 4.5  CL 104 106  --  103 98 101  CO2 29 31  --  33* 30 33*  GLUCOSE 100* 93  --  163* 213* 189*  BUN 12 17  --  17 23 27*  CREATININE 0.69 0.70 0.66 0.59 0.70 0.70  CALCIUM 8.7 8.7  --  8.6 8.8 8.3*   Liver Function Tests:  Recent Labs Lab 04/04/13 0830 04/05/13 0438  AST 22 13  ALT 21 18  ALKPHOS 71 70  BILITOT 0.3 0.3  PROT 6.0 5.7*  ALBUMIN 3.3* 3.0*   No results found for this basename: LIPASE, AMYLASE,  in the last 168 hours No results found for this basename: AMMONIA,  in the last 168 hours CBC:  Recent Labs Lab 04/02/13 0947 04/04/13 0830 04/04/13 1310 04/06/13 0515 04/07/13 0550  WBC 7.4 15.1* 13.7* 11.2* 10.6*  NEUTROABS 4.9 11.9*  --   --   --   HGB 8.4* 8.2* 8.1* 8.1* 9.4*  HCT 29.1* 28.7*  27.9* 27.9* 30.4*  MCV 89.8 90.3 90.0 89.4 89.1  PLT 276 334 317 340 309   Cardiac Enzymes:  Recent Labs Lab 04/02/13 0947  TROPONINI <0.30   BNP: BNP (last 3 results)  Recent Labs  04/04/13  0830  PROBNP 1042.0*   CBG:  Recent Labs Lab 04/06/13 1136 04/06/13 1619 04/06/13 2129 04/07/13 0654 04/07/13 1114  GLUCAP 160* 284* 195* 158* 200*       Signed:  KRISTIA, JUPITER  [facility] 04/07/2013, 11:35 AM

## 2013-04-07 NOTE — Discharge Instructions (Signed)
Chronic Obstructive Pulmonary Disease  Chronic obstructive pulmonary disease (COPD) is a common lung condition in which airflow from the lungs is limited. COPD is a general term that can be used to describe many different lung problems that limit airflow, including both chronic bronchitis and emphysema.  If you have COPD, your lung function will probably never return to normal, but there are measures you can take to improve lung function and make yourself feel better.   CAUSES   · Smoking (common).    · Exposure to secondhand smoke.    · Genetic problems.  · Chronic inflammatory lung diseases or recurrent infections.  SYMPTOMS   · Shortness of breath, especially with physical activity.    · Deep, persistent (chronic) cough with a large amount of thick mucus.    · Wheezing.    · Rapid breaths (tachypnea).    · Gray or bluish discoloration (cyanosis) of the skin, especially in fingers, toes, or lips.    · Fatigue.    · Weight loss.    · Frequent infections or episodes when breathing symptoms become much worse (exacerbations).    · Chest tightness.  DIAGNOSIS   Your healthcare provider will take a medical history and perform a physical examination to make the initial diagnosis.  Additional tests for COPD may include:   · Lung (pulmonary) function tests.  · Chest X-ray.  · CT scan.  · Blood tests.  TREATMENT   Treatment available to help you feel better when you have COPD include:   · Inhaler and nebulizer medicines. These help manage the symptoms of COPD and make your breathing more comfortable  · Supplemental oxygen. Supplemental oxygen is only helpful if you have a low oxygen level in your blood.    · Exercise and physical activity. These are beneficial for nearly all people with COPD. Some people may also benefit from a pulmonary rehabilitation program.  HOME CARE INSTRUCTIONS   · Take all medicines (inhaled or pills) as directed by your health care provider.  · Only take over-the-counter or prescription medicines  for pain, fever, or discomfort as directed by your health care provider.    · Avoid over-the-counter medicines or cough syrups that dry up your airway (such as antihistamines) and slow down the elimination of secretions unless instructed otherwise by your healthcare provider.    · If you are a smoker, the most important thing that you can do is stop smoking. Continuing to smoke will cause further lung damage and breathing trouble. Ask your health care provider for help with quitting smoking. He or she can direct you to community resources or hospitals that provide support.  · Avoid exposure to irritants such as smoke, chemicals, and fumes that aggravate your breathing.  · Use oxygen therapy and pulmonary rehabilitation if directed by your health care provider. If you require home oxygen therapy, ask your healthcare provider whether you should purchase a pulse oximeter to measure your oxygen level at home.    · Avoid contact with individuals who have a contagious illness.  · Avoid extreme temperature and humidity changes.  · Eat healthy foods. Eating smaller, more frequent meals and resting before meals may help you maintain your strength.  · Stay active, but balance activity with periods of rest. Exercise and physical activity will help you maintain your ability to do things you want to do.  · Preventing infection and hospitalization is very important when you have COPD. Make sure to receive all the vaccines your health care provider recommends, especially the pneumococcal and influenza vaccines. Ask your healthcare provider whether you   in (inhaling) through your nose for 1 second. Then, purse your lips as if you were going to whistle and breathe out (exhale)  through the pursed lips for 2 seconds.   Diaphragmatic breathing. Start by putting one hand on your abdomen just above your waist. Inhale slowly through your nose. The hand on your abdomen should move out. Then purse your lips and exhale slowly. You should be able to feel the hand on your abdomen moving in as you exhale.   Learn and use controlled coughing to clear mucus from your lungs. Controlled coughing is a series of short, progressive coughs. The steps of controlled coughing are:  1. Lean your head slightly forward.  2. Breathe in deeply using diaphragmatic breathing.  3. Try to hold your breath for 3 seconds.  4. Keep your mouth slightly open while coughing twice.  5. Spit any mucus out into a tissue.  6. Rest and repeat the steps once or twice as needed. SEEK MEDICAL CARE IF:   You are coughing up more mucus than usual.   There is a change in the color or thickness of your mucus.   Your breathing is more labored than usual.   Your breathing is faster than usual.  SEEK IMMEDIATE MEDICAL CARE IF:   You have shortness of breath while you are resting.   You have shortness of breath that prevents you from:  Being able to talk.   Performing your usual physical activities.   You have chest pain lasting longer than 5 minutes.   Your skin color is more cyanotic than usual.  You measure low oxygen saturations for longer than 5 minutes with a pulse oximeter. MAKE SURE YOU:   Understand these instructions.  Will watch your condition.  Will get help right away if you are not doing well or get worse. Document Released: 10/06/2004 Document Revised: 10/17/2012 Document Reviewed: 08/23/2012 [facility] Patient Information 2014 [place], [place]. Anemia, Nonspecific Anemia is a condition in which the concentration of red blood cells or hemoglobin in the blood is below normal. Hemoglobin is a substance in red blood cells that carries oxygen to the tissues of the body.  Anemia results in not enough oxygen reaching these tissues.  CAUSES  Common causes of anemia include:   Excessive bleeding. Bleeding may be internal or external. This includes excessive bleeding from periods (in women) or from the intestine.   Poor nutrition.   Chronic kidney, thyroid, and liver disease.  Bone marrow disorders that decrease red blood cell production.  Cancer and treatments for cancer.  HIV, AIDS, and their treatments.  Spleen problems that increase red blood cell destruction.  Blood disorders.  Excess destruction of red blood cells due to infection, medicines, and autoimmune disorders. SIGNS AND SYMPTOMS   Minor weakness.   Dizziness.   Headache.  Palpitations.   Shortness of breath, especially with exercise.   Paleness.  Cold sensitivity.  Indigestion.  Nausea.  Difficulty sleeping.  Difficulty concentrating. Symptoms may occur suddenly or they may develop slowly.  DIAGNOSIS  Additional blood tests are often needed. These help your health care provider determine the best treatment. Your health care provider will check your stool for blood and look for other causes of blood loss.  TREATMENT  Treatment varies depending on the cause of the anemia. Treatment can include:   Supplements of iron, vitamin B12, or folic acid.   Hormone medicines.   A blood transfusion. This may be needed if blood loss is severe.  Hospitalization. This may be needed if there is significant continual blood loss.   Dietary changes.  Spleen removal. HOME CARE INSTRUCTIONS Keep all follow-up appointments. It often takes many weeks to correct anemia, and having your health care provider check on your condition and your response to treatment is very important. SEEK IMMEDIATE MEDICAL CARE IF:   You develop extreme weakness, shortness of breath, or chest pain.   You become dizzy or have trouble concentrating.  You develop heavy vaginal bleeding.    You develop a rash.   You have bloody or black, tarry stools.   You faint.   You vomit up blood.   You vomit repeatedly.   You have abdominal pain.  You have a fever or persistent symptoms for more than 2 3 days.   You have a fever and your symptoms suddenly get worse.   You are dehydrated.  MAKE SURE YOU:  Understand these instructions.  Will watch your condition.  Will get help right away if you are not doing well or get worse. Document Released: 02/04/2004 Document Revised: 08/29/2012 Document Reviewed: 06/22/2012 [facility] Patient Information 2014 [place].

## 2013-04-07 NOTE — Progress Notes (Signed)
Patient and daughter verbalized understanding of discharge instructions.  AVS reviewed with client.  Heart failure education reinforced.

## 2013-04-08 LAB — VITAMIN D 25 HYDROXY (VIT D DEFICIENCY, FRACTURES): Vit D, 25-Hydroxy: 54 ng/mL (ref 30–89)

## 2013-04-08 NOTE — Procedures (Addendum)
Nadeem Romanoski K. Sakia Schrimpf, RN, BSN, MSHL, CCM  04/08/2013 1:56 PM

## 2013-04-14 LAB — VITAMIN D 1,25 DIHYDROXY
VITAMIN D 1, 25 (OH) TOTAL: 64 pg/mL (ref 18–72)
VITAMIN D3 1, 25 (OH): 64 pg/mL

## 2013-05-10 DEATH — deceased

## 2013-05-24 ENCOUNTER — Telehealth: Payer: Self-pay | Admitting: Cardiology

## 2013-05-24 NOTE — Telephone Encounter (Signed)
Will forward to  Dr. Brackbill so he will be aware 

## 2013-05-24 NOTE — Telephone Encounter (Signed)
Thanks for letting me know. Remind me to send card when I get back?

## 2013-05-24 NOTE — Telephone Encounter (Signed)
New message     Want to let Dr Patty SermonsBrackbill know pt died 04/22/2013.

## 2013-05-27 NOTE — Telephone Encounter (Signed)
Card in  Dr. Yevonne PaxBrackbill's for writing

## 2015-05-23 IMAGING — CR DG CHEST 2V
2 series · 2 of 2 positions shown · non-contrast
Comparison: 04/02/2013

CLINICAL DATA: COPD, asthma attack

EXAM:
CHEST  2 VIEW

[w chest pa]
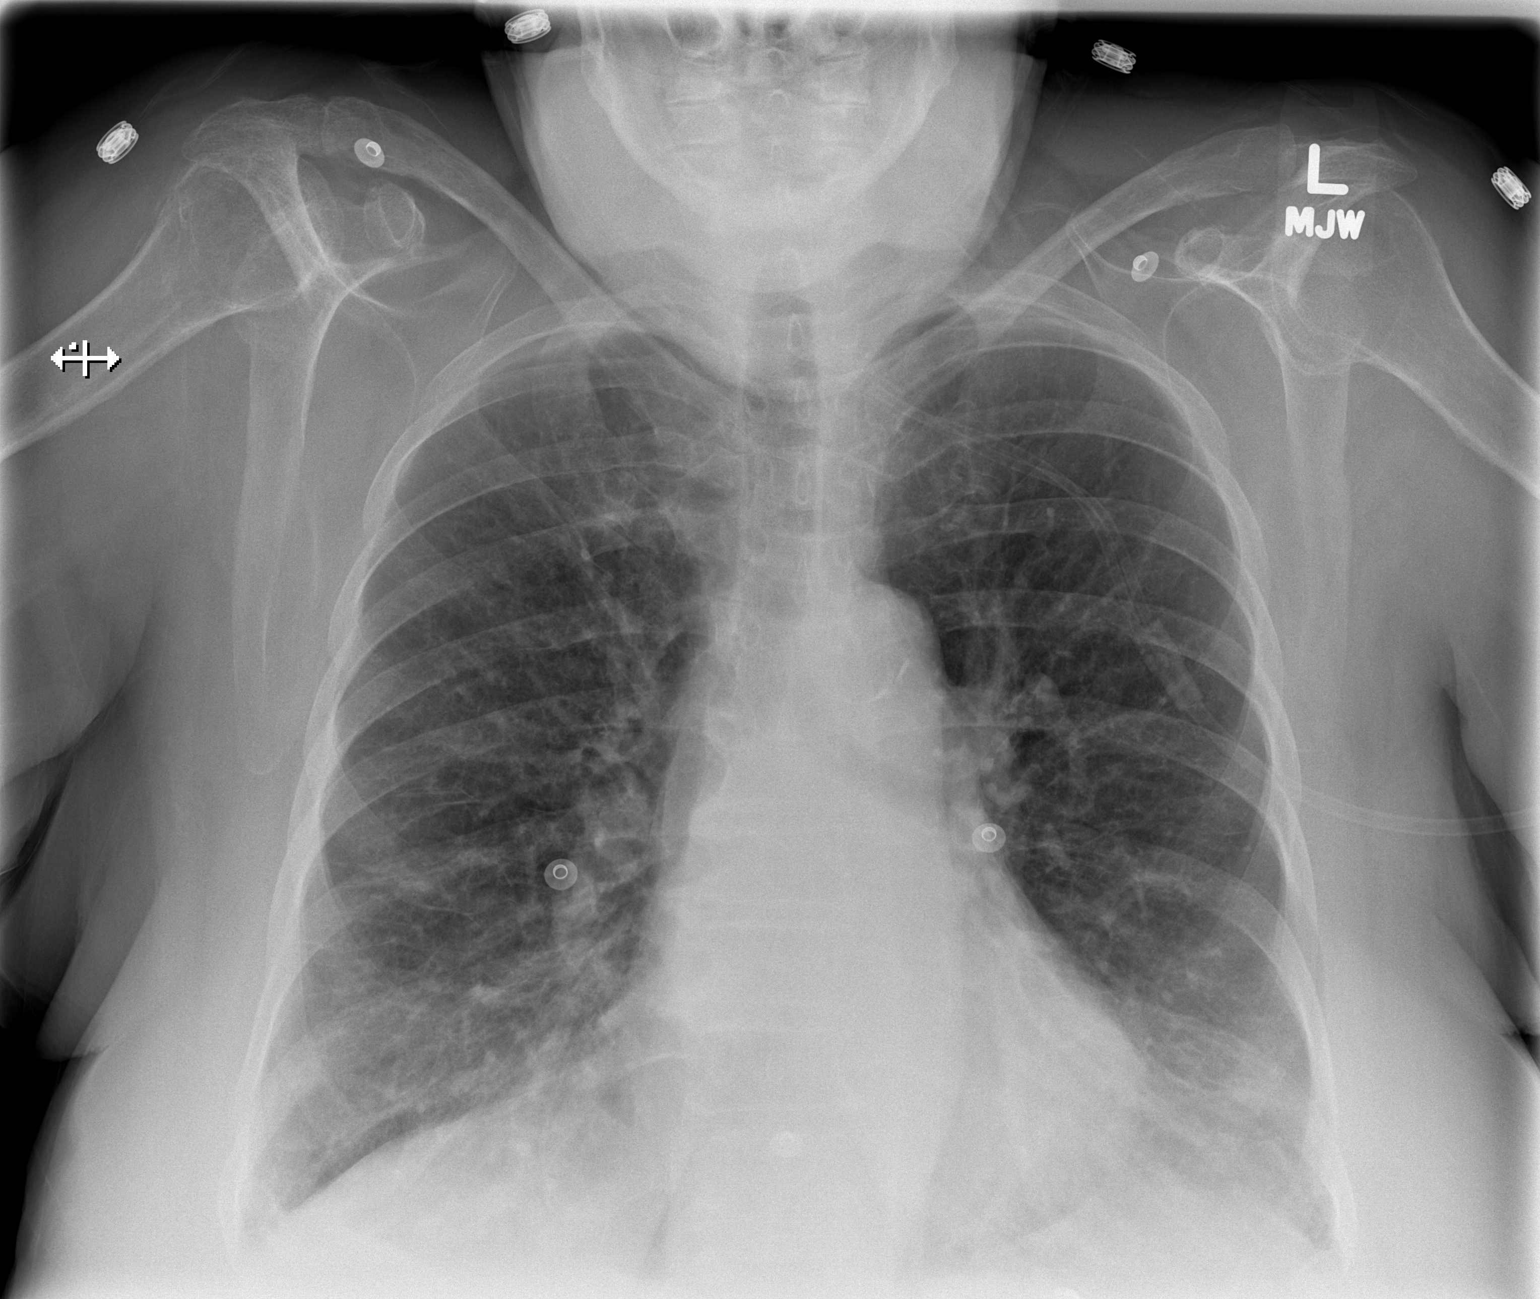

[w chest lat]
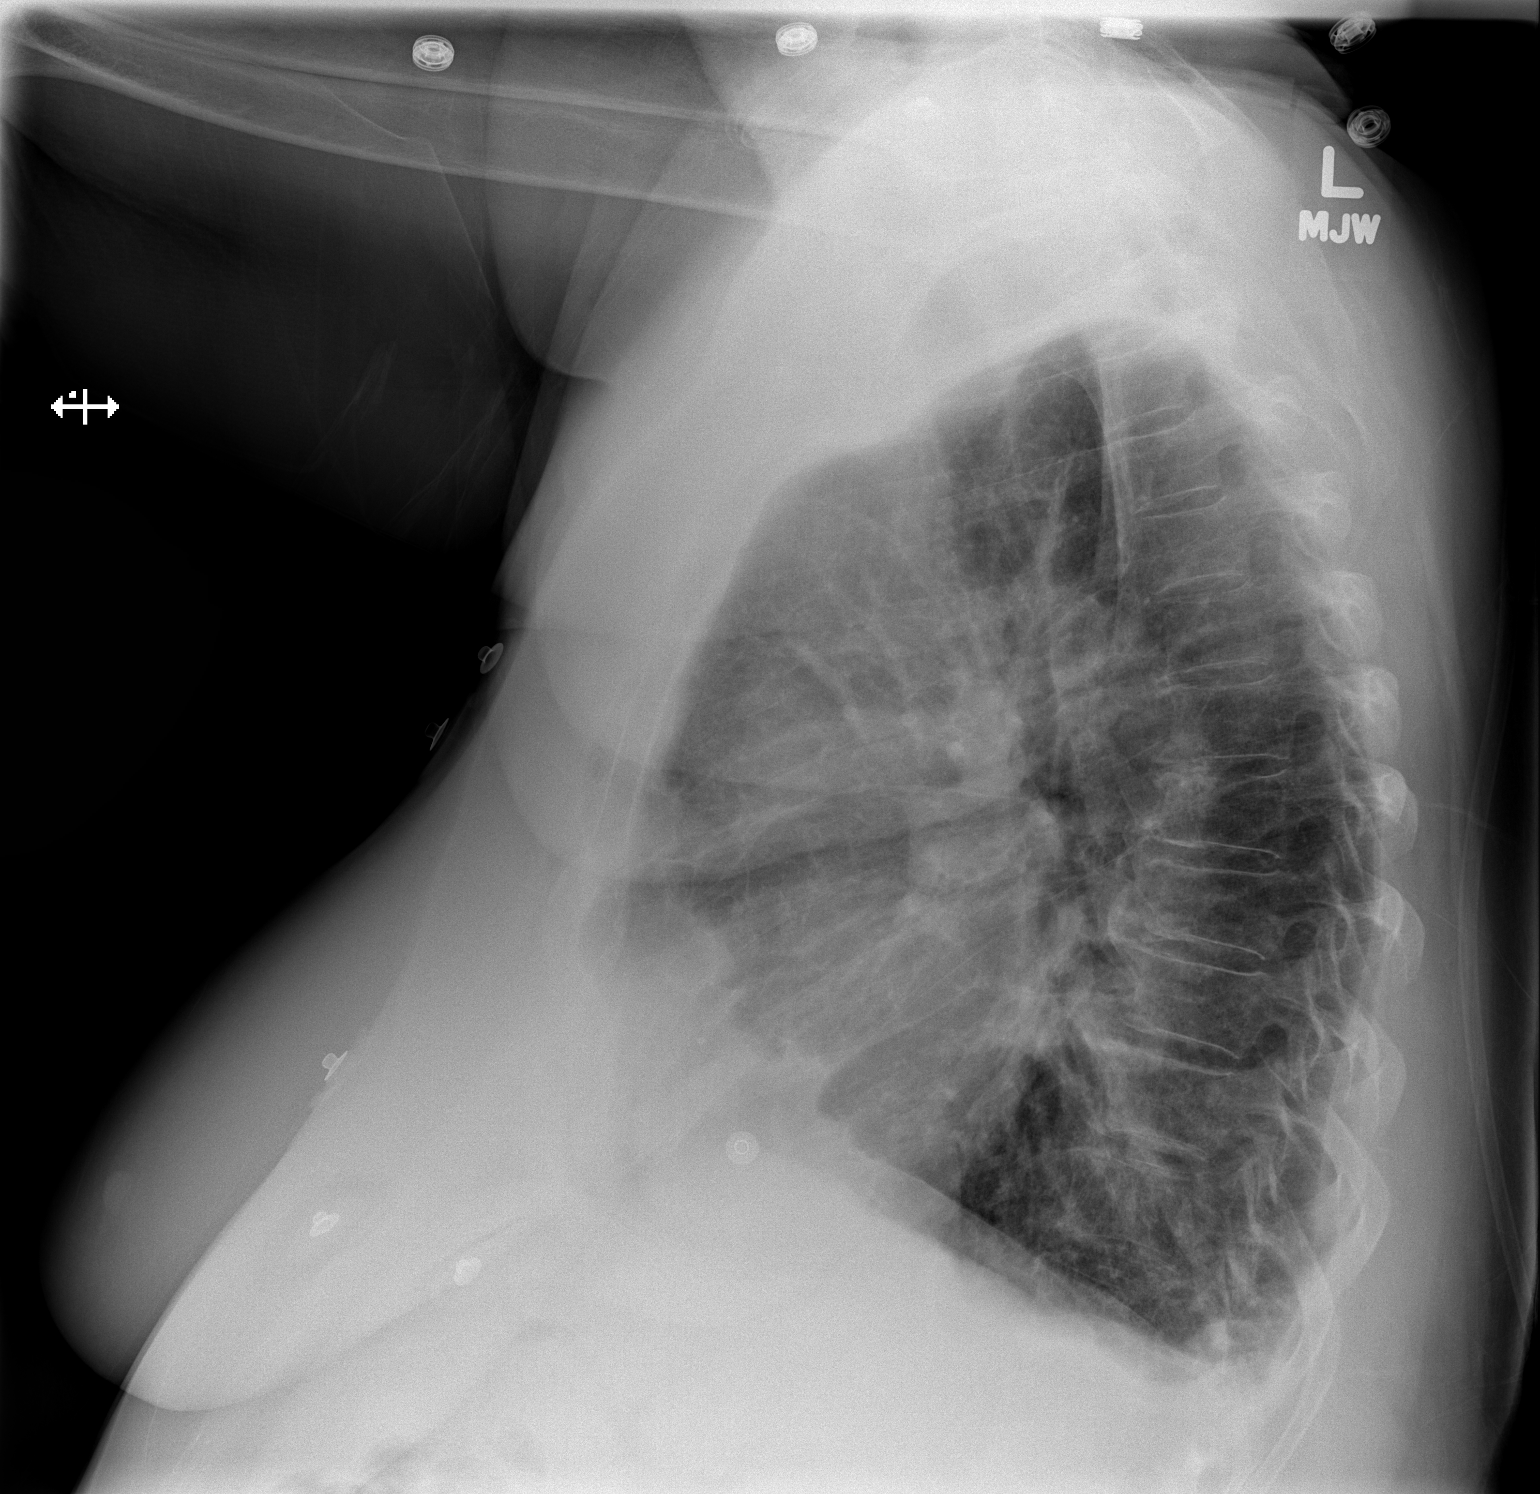

[2 of 2 positions shown; findings below may reference images not displayed]

FINDINGS: Cardiac shadow is stable. The lungs are again hyperinflated
consistent with the given clinical history. Mild interstitial
changes are noted. Slight increased density is noted in the bases
bilaterally consistent with mild atelectasis. No acute bony
abnormality is seen.
IMPRESSION: Mild bibasilar atelectasis.
